# Patient Record
Sex: Female | Born: 1965
Health system: Southern US, Community
[De-identification: ages and names within clinical notes are randomized; demographics above are authoritative.]

## PROBLEM LIST (undated history)

## (undated) DIAGNOSIS — L309 Dermatitis, unspecified: Secondary | ICD-10-CM

## (undated) DIAGNOSIS — R002 Palpitations: Secondary | ICD-10-CM

## (undated) DIAGNOSIS — K760 Fatty (change of) liver, not elsewhere classified: Secondary | ICD-10-CM

## (undated) DIAGNOSIS — R0600 Dyspnea, unspecified: Secondary | ICD-10-CM

## (undated) DIAGNOSIS — K5900 Constipation, unspecified: Secondary | ICD-10-CM

## (undated) DIAGNOSIS — M549 Dorsalgia, unspecified: Secondary | ICD-10-CM

## (undated) DIAGNOSIS — M2550 Pain in unspecified joint: Secondary | ICD-10-CM

## (undated) DIAGNOSIS — R7303 Prediabetes: Secondary | ICD-10-CM

## (undated) DIAGNOSIS — F341 Dysthymic disorder: Secondary | ICD-10-CM

## (undated) DIAGNOSIS — K219 Gastro-esophageal reflux disease without esophagitis: Secondary | ICD-10-CM

## (undated) DIAGNOSIS — F419 Anxiety disorder, unspecified: Secondary | ICD-10-CM

## (undated) DIAGNOSIS — G4733 Obstructive sleep apnea (adult) (pediatric): Secondary | ICD-10-CM

## (undated) DIAGNOSIS — E559 Vitamin D deficiency, unspecified: Secondary | ICD-10-CM

## (undated) DIAGNOSIS — K589 Irritable bowel syndrome without diarrhea: Secondary | ICD-10-CM

## (undated) DIAGNOSIS — E785 Hyperlipidemia, unspecified: Secondary | ICD-10-CM

## (undated) HISTORY — DX: Constipation, unspecified: K59.00

## (undated) HISTORY — DX: Vitamin D deficiency, unspecified: E55.9

## (undated) HISTORY — PX: OTHER SURGICAL HISTORY: SHX169

## (undated) HISTORY — DX: Dorsalgia, unspecified: M54.9

## (undated) HISTORY — DX: Prediabetes: R73.03

## (undated) HISTORY — DX: Irritable bowel syndrome, unspecified: K58.9

## (undated) HISTORY — DX: Fatty (change of) liver, not elsewhere classified: K76.0

## (undated) HISTORY — DX: Dysthymic disorder: F34.1

## (undated) HISTORY — DX: Morbid (severe) obesity due to excess calories: E66.01

## (undated) HISTORY — DX: Gastro-esophageal reflux disease without esophagitis: K21.9

## (undated) HISTORY — PX: GALLBLADDER SURGERY: SHX652

## (undated) HISTORY — DX: Anxiety disorder, unspecified: F41.9

## (undated) HISTORY — PX: BREAST EXCISIONAL BIOPSY: SUR124

## (undated) HISTORY — DX: Dyspnea, unspecified: R06.00

## (undated) HISTORY — DX: Obstructive sleep apnea (adult) (pediatric): G47.33

## (undated) HISTORY — DX: Pain in unspecified joint: M25.50

## (undated) HISTORY — DX: Hyperlipidemia, unspecified: E78.5

## (undated) HISTORY — DX: Dermatitis, unspecified: L30.9

## (undated) HISTORY — DX: Palpitations: R00.2

---

## 2013-08-27 ENCOUNTER — Ambulatory Visit: Payer: Self-pay | Admitting: Podiatry

## 2013-09-04 ENCOUNTER — Telehealth: Payer: Self-pay

## 2013-09-04 NOTE — Telephone Encounter (Signed)
Patient called returning Chelle's call. Patient wants Chelle to call her back at [phone].   Thank You!

## 2013-09-04 NOTE — Telephone Encounter (Signed)
I dont see a phone call from Chelle. Please advise.

## 2013-09-05 NOTE — Telephone Encounter (Signed)
This patient was seen for a WC case.  Phone messaging and notes are found in her paper record and in Fish farm managerMedical Manager.  I have spoken with her by phone.

## 2013-09-12 ENCOUNTER — Ambulatory Visit (INDEPENDENT_AMBULATORY_CARE_PROVIDER_SITE_OTHER): Payer: 59 | Admitting: Podiatry

## 2013-09-12 ENCOUNTER — Encounter: Payer: Self-pay | Admitting: Podiatry

## 2013-09-12 ENCOUNTER — Ambulatory Visit (INDEPENDENT_AMBULATORY_CARE_PROVIDER_SITE_OTHER): Payer: 59

## 2013-09-12 VITALS — BP 132/70 | HR 70 | Resp 12 | Ht 65.0 in | Wt 281.0 lb

## 2013-09-12 DIAGNOSIS — R52 Pain, unspecified: Secondary | ICD-10-CM

## 2013-09-12 DIAGNOSIS — M779 Enthesopathy, unspecified: Secondary | ICD-10-CM

## 2013-09-12 DIAGNOSIS — M778 Other enthesopathies, not elsewhere classified: Secondary | ICD-10-CM

## 2013-09-12 DIAGNOSIS — M7750 Other enthesopathy of unspecified foot: Secondary | ICD-10-CM

## 2013-09-12 NOTE — Progress Notes (Signed)
   Subjective:    Patient ID: Amalia HaileyJulie Felch, female    DOB: 01/20/1966, 48 y.o.   MRN: [id number]  HPI '' LT BALL OF THE FOOT AND 2ND TOE HAVE BURNING SENSATION AND SORE FOR 2 MONTHS. ALSO RT FOOT GREAT TOENAIL IS A LITTLE SORE 1 YEAR AND TRIED NO TREATMENT.    Review of Systems  Constitutional: Positive for unexpected weight change.  All other systems reviewed and are negative.       Objective:   Physical Exam: I have reviewed her past medical history medications allergies surgeries social history. Also stable she is alert oriented x3. Neurologic sensorium is intact. No calf pain. Orthopedic evaluation demonstrates pain on palpation and in range of motion of the second metatarsophalangeal joint of the right foot.        Assessment & Plan:  Capsulitis second metatarsophalangeal joint.  Plan: Periarticular injection today.

## 2013-09-24 ENCOUNTER — Institutional Professional Consult (permissible substitution): Payer: 59 | Admitting: Neurology

## 2013-10-08 ENCOUNTER — Other Ambulatory Visit: Payer: Self-pay | Admitting: Obstetrics & Gynecology

## 2013-10-08 DIAGNOSIS — R928 Other abnormal and inconclusive findings on diagnostic imaging of breast: Secondary | ICD-10-CM

## 2013-10-10 ENCOUNTER — Ambulatory Visit: Payer: 59 | Admitting: Podiatry

## 2013-10-30 ENCOUNTER — Other Ambulatory Visit: Payer: Self-pay | Admitting: Obstetrics & Gynecology

## 2013-10-30 ENCOUNTER — Ambulatory Visit
Admission: RE | Admit: 2013-10-30 | Discharge: 2013-10-30 | Disposition: A | Discharge status: Home / Self Care | Payer: 59 | Source: Ambulatory Visit | Attending: Obstetrics & Gynecology | Admitting: Obstetrics & Gynecology

## 2013-10-30 DIAGNOSIS — R928 Other abnormal and inconclusive findings on diagnostic imaging of breast: Secondary | ICD-10-CM

## 2013-11-04 ENCOUNTER — Encounter: Payer: Self-pay | Admitting: *Deleted

## 2013-11-04 ENCOUNTER — Encounter (INDEPENDENT_AMBULATORY_CARE_PROVIDER_SITE_OTHER): Payer: Self-pay

## 2013-11-04 ENCOUNTER — Ambulatory Visit (INDEPENDENT_AMBULATORY_CARE_PROVIDER_SITE_OTHER): Payer: 59 | Admitting: Neurology

## 2013-11-04 VITALS — BP 112/70 | HR 69 | Resp 18 | Ht 65.75 in | Wt 278.0 lb

## 2013-11-04 DIAGNOSIS — E119 Type 2 diabetes mellitus without complications: Secondary | ICD-10-CM

## 2013-11-04 DIAGNOSIS — Z9989 Dependence on other enabling machines and devices: Principal | ICD-10-CM

## 2013-11-04 DIAGNOSIS — G4733 Obstructive sleep apnea (adult) (pediatric): Secondary | ICD-10-CM | POA: Insufficient documentation

## 2013-11-04 NOTE — Patient Instructions (Signed)
Obesity Obesity is having too much body fat and a body mass index (BMI) of 30 or more. BMI is a number based on your height and weight. The number is an estimate of how much body fat you have. Obesity can happen if you eat more calories than you can burn by exercising or other activity. It can cause major health problems or emergencies.  HOME CARE  Exercise and be active as told by your doctor. Try:  Using stairs when you can.  Parking farther away from store doors.  Gardening, biking, or walking.  Eat healthy foods and drinks that are low in calories. Eat more fruits and vegetables.  Limit fast food, sweets, and snack foods that are made with ingredients that are not natural (processed food).  Eat smaller amounts of food.  Keep a journal and write down what you eat every day. Websites can help with this.  Avoid drinking alcohol. Drink more water and drinks without calories.   Take vitamins and dietary pills (supplements) only as told by your doctor.  Try going to weight-loss support groups or classes to help lessen stress. Dieticians and counselors may also help. GET HELP RIGHT AWAY IF:  You have chest pain or tightness.  You have trouble breathing or feel short of breath.  You feel weak or have loss of feeling (numbness) in your legs.  You feel confused or have trouble talking.  You have sudden changes in your vision. MAKE SURE YOU:  Understand these instructions.  Will watch your condition.  Will get help right away if you are not doing well or get worse. Document Released: 10/17/2011 Document Reviewed: 10/17/2011 [facility] Patient Information 2014 [place], [place]. Sleep Apnea  Sleep apnea is a sleep disorder characterized by abnormal pauses in breathing while you sleep. When your breathing pauses, the level of oxygen in your blood decreases. This causes you to move out of deep sleep and into light sleep. As a result, your quality of sleep is poor, and the system that  carries your blood throughout your body (cardiovascular system) experiences stress. If sleep apnea remains untreated, the following conditions can develop:  High blood pressure (hypertension).  Coronary artery disease.  Inability to achieve or maintain an erection (impotence).  Impairment of your thought process (cognitive dysfunction). There are three types of sleep apnea: 1. Obstructive sleep apnea Pauses in breathing during sleep because of a blocked airway. 2. Central sleep apnea Pauses in breathing during sleep because the area of the brain that controls your breathing does not send the correct signals to the muscles that control breathing. 3. Mixed sleep apnea A combination of both obstructive and central sleep apnea. RISK FACTORS The following risk factors can increase your risk of developing sleep apnea:  Being overweight.  Smoking.  Having narrow passages in your nose and throat.  Being of older age.  Being female.  Alcohol use.  Sedative and tranquilizer use.  Ethnicity. Among individuals younger than 35 years, African Americans are at increased risk of sleep apnea. SYMPTOMS   Difficulty staying asleep.  Daytime sleepiness and fatigue.  Loss of energy.  Irritability.  Loud, heavy snoring.  Morning headaches.  Trouble concentrating.  Forgetfulness.  Decreased interest in sex. DIAGNOSIS  In order to diagnose sleep apnea, your caregiver will perform a physical examination. Your caregiver may suggest that you take a home sleep test. Your caregiver may also recommend that you spend the night in a sleep lab. In the sleep lab, several monitors record information  about your heart, lungs, and brain while you sleep. Your leg and arm movements and blood oxygen level are also recorded. TREATMENT The following actions may help to resolve mild sleep apnea:  Sleeping on your side.   Using a decongestant if you have nasal congestion.   Avoiding the use of  depressants, including alcohol, sedatives, and narcotics.   Losing weight and modifying your diet if you are overweight. There also are devices and treatments to help open your airway:  Oral appliances. These are custom-made mouthpieces that shift your lower jaw forward and slightly open your bite. This opens your airway.  Devices that create positive airway pressure. This positive pressure "splints" your airway open to help you breathe better during sleep. The following devices create positive airway pressure:  Continuous positive airway pressure (CPAP) device. The CPAP device creates a continuous level of air pressure with an air pump. The air is delivered to your airway through a mask while you sleep. This continuous pressure keeps your airway open.  Nasal expiratory positive airway pressure (EPAP) device. The EPAP device creates positive air pressure as you exhale. The device consists of single-use valves, which are inserted into each nostril and held in place by adhesive. The valves create very little resistance when you inhale but create much more resistance when you exhale. That increased resistance creates the positive airway pressure. This positive pressure while you exhale keeps your airway open, making it easier to breath when you inhale again.  Bilevel positive airway pressure (BPAP) device. The BPAP device is used mainly in patients with central sleep apnea. This device is similar to the CPAP device because it also uses an air pump to deliver continuous air pressure through a mask. However, with the BPAP machine, the pressure is set at two different levels. The pressure when you exhale is lower than the pressure when you inhale.  Surgery. Typically, surgery is only done if you cannot comply with less invasive treatments or if the less invasive treatments do not improve your condition. Surgery involves removing excess tissue in your airway to create a wider passage way. Document  Released: 07/15/2002 Document Revised: 11/19/2012 Document Reviewed: 12/01/2011 [facility] Patient Information 2014 [place], [place].

## 2013-11-04 NOTE — Progress Notes (Signed)
[facility]  Provider:  Melvyn Novasarmen  Mckale Haffey, M D  Referring Provider: Marthe Webster, Michelle Stewa* Primary Care Physician:  Michelle Webster,Michelle STEWART, MD  Chief Complaint  Patient presents with  . New Evaluation    Room 10  . sleep consult    HPI:  Michelle Webster is a 48 y.o. female  Is seen here as a referral   from Dr. Mathis Webster , Michelle Webster . Her new PCP will be Dr. Zachery Webster for new CPAP suplpies.    Mrs. Michelle Webster (at that time Michelle Webster) was tested  on 11/[facility] by Dr. Catha Webster.  She was diagnosed with obstructive sleep apnea with an RDI of 72 per hour and this all oxygen levels at nadir at 82% of saturation. She also was moderately snoring.  The AHI was 37 per hour indicating that she had a severe sleep apnea at the time of her sleep test her BMI was 42, CO2 had not been tested. The patient was titrated to 11 cm she also states that she has been compliant with her machine but needs new supplies. [place] 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  In preparation for her wedding ,she lost 30 pounds to 259 . She regained after her honeymoon and is today 278 pounds.   She feels sleepier and her supplies need to be ordered , she establishes here a new DME and MD connection.   The patient reports that she pulls to bed around 2300 hours, he falls asleep fairly promptly. She will have more nocturia break at night. She rises in the morning at 5:30 AM she will need alarm. She has regular breakfast in the morning, feels that she would like to sleep longer. Her diet is caffee free, she drinks tea. Doesn't nap.    The patient is aware that she snores loudly when not using CPAP, but as far as she knows she is not snoring when on CPAP.  Sometimes she would have a dry mouth in the morning.  Some mornings she may have a dull headache but she never has been awoken by  headaches from sleep. She has no shift work  history. Her bedroom is cool , quiet and dark.    the patient has a strong family history of obesity and apnea, father and paternal aunts and uncles, her sister tested negative 5 years ago, she is [name] , too. Works first shift . her mother underwent a sleep study after falling asleep at the wheel and causing an accident.     I was able to obtain data downloads from the CPAP machine today the average daily use of CPAP at 7 hours and 29 minutes indicating very high compliance.  The patient has a 99.4 % compliance 180 days. Her residual apnea hypoxia index is 0.5, there is only a mild air leak . 11 cm water had been continued  with good results.  Her husband has complex apnea is on BIPAP on 14 cm water.      Review of Systems: Out of a complete 14 system review, the patient complains of only the following symptoms, and all other reviewed systems are negative.  snoring, EDS  Not answered and FSS was 36.  Weight gain.    History   Social History  . Marital Status: Married    Spouse Name: Michelle NovemberMike    Number of Children: 0  . Years of Education: BSN  Occupational History  .  [employer]   Social History Main Topics  . Smoking status: Never Smoker   . Smokeless tobacco: Never Used  . Alcohol Use: No  . Drug Use: Not on file  . Sexual Activity: Not on file   Other Topics Concern  . Not on file   Social History Narrative   Patient is married Michelle November) and lives at home with her husband.   Patient is a Charity fundraiser, works at [employer].   Patient has a Barista.   Patient is right-handed.   Patient drinks tea (2-3 glasses ) per day.    History reviewed. No pertinent family history.  Past Medical History  Diagnosis Date  . Anxiety   . Dyspnea   . Morbid obesity   . Neurotic depression   . OSA (obstructive sleep apnea)     Past Surgical History  Procedure Laterality Date  . Lum pectomy    . Gallbladder surgery      Current Outpatient  Prescriptions  Medication Sig Dispense Refill  . Cholecalciferol (VITAMIN D) 2000 UNITS tablet Take 2,000 Units by mouth daily.      . citalopram (CELEXA) 40 MG tablet       . docusate sodium (COLACE) 100 MG capsule Take 100 mg by mouth 2 (two) times daily.      . magnesium 30 MG tablet Take 30 mg by mouth 2 (two) times daily. As needed      . metFORMIN (GLUCOPHAGE) 500 MG tablet Take 500 mg by mouth daily.      [name] omeprazole (PRILOSEC) 20 MG capsule       . Prenatal Vit-Fe Fumarate-FA (PRENATAL MULTIVITAMIN) TABS tablet Take 1 tablet by mouth daily at 12 noon.       No current facility-administered medications for this visit.    Allergies as of 11/04/2013 - Review Complete 11/04/2013  Allergen Reaction Noted  . Vicodin [hydrocodone-acetaminophen] Itching 09/12/2013    Vitals: BP 112/70  Pulse 69  Resp 18  Ht 5' 5.75" (1.67 m)  Wt 278 lb (126.1 kg)  BMI 45.21 kg/m2  LMP 09/12/2013 Last Weight:  Wt Readings from Last 1 Encounters:  11/04/13 278 lb (126.1 kg)   Last Height:   Ht Readings from Last 1 Encounters:  11/04/13 5' 5.75" (1.67 m)    Physical exam:  General: The patient is awake, alert and appears not in acute distress. The patient is well groomed. Head: Normocephalic, atraumatic. Neck is supple. Mallampati 3, neck circumference:19.5 inches. Cardiovascular:  Regular rate and rhythm, without  murmurs or carotid bruit, and without distended neck veins. Respiratory: Lungs are clear to auscultation. Skin:  Without evidence of edema, or rash Trunk: BMI is elevated and patient  has normal posture.  Neurologic exam : The patient is awake and alert, oriented to place and time.  Memory subjective  described as intact. There is a normal attention span & concentration ability.  Speech is fluent without  dysarthria, dysphonia or aphasia. Mood and affect are appropriate.  Cranial nerves: Pupils are equal and briskly reactive to light. Funduscopic exam without  evidence of  pallor or edema. Extraocular movements  in vertical and horizontal planes intact and without nystagmus. Visual fields by finger perimetry are intact. Hearing to finger rub intact.  Facial sensation intact to fine touch. Facial motor strength is symmetric and tongue and uvula move midline.  Motor exam:   Normal tone and normal muscle bulk and symmetric normal strength in  all extremities.  Sensory:  Fine touch, pinprick and vibration were tested in all extremities. Proprioception is tested in the upper extremities only. This was  normal.  Coordination: Rapid alternating movements in the fingers/hands is tested and normal.   Gait and station: Patient walks without assistive device . Strength within normal limits. Stance is stable and normal.  Deep tendon reflexes: in the  upper and lower extremities are symmetric and intact. Babinski maneuver response is  downgoing.   Assessment:  After physical and neurologic examination, review of laboratory studies, imaging, neurophysiology testing and pre-existing records, assessment is    1)resurgence of fatigue and sleepiness in a patient with recent diagnosis of DM and weight gain.  2) thyroid was fine. No depression.  3) download documented excellent CPAP compliance , and very low residual AHI. She would not medically need a re- titration unless her insurance or DME insist.  I will order a new filter, headset and replacement mask for small size , true blue nasal mask from [employer].      Plan:  Treatment plan and additional workup :

## 2013-11-05 ENCOUNTER — Encounter: Payer: Self-pay | Admitting: Neurology

## 2013-11-26 ENCOUNTER — Ambulatory Visit: Payer: 59 | Admitting: Podiatry

## 2013-11-26 ENCOUNTER — Encounter: Payer: Self-pay | Admitting: Podiatry

## 2013-11-26 ENCOUNTER — Ambulatory Visit (INDEPENDENT_AMBULATORY_CARE_PROVIDER_SITE_OTHER): Payer: 59 | Admitting: Podiatry

## 2013-11-26 VITALS — BP 128/71 | HR 70 | Resp 16

## 2013-11-26 DIAGNOSIS — M779 Enthesopathy, unspecified: Principal | ICD-10-CM

## 2013-11-26 DIAGNOSIS — M7750 Other enthesopathy of unspecified foot: Secondary | ICD-10-CM

## 2013-11-26 DIAGNOSIS — M778 Other enthesopathies, not elsewhere classified: Secondary | ICD-10-CM

## 2013-11-26 NOTE — Progress Notes (Signed)
She presents today for followup of her capsulitis second digit left foot. She states that it was doing pretty good for about a month and then I missed my appointment and has slowly started to come back. She does relate wearing flip flops and sandals and going barefoot. He still feels better now than it did previously.  Objective: Vital signs are stable she is alert and oriented x3 pulses are palpable there is no calf pain. She has pain on palpation as well as distraction of the second metatarsophalangeal joint. There is mild edema no erythema cellulitis drainage or odor.  Assessment: Capsulitis second metatarsophalangeal joint left.  Plan: At this point we performed a joint aspiration after sterile Betadine skin prep of the skin we now.the foot with 3 cc of a 50-50 Mr. Marcaine plain lidocaine plain. I then introduced a needle into the joint space draining approximately one half cc of joint fluid and then replaced back with one half cc of Kenalog. She tolerated procedure well we discussed shoe gear and I will followup with her in one month to 6 weeks at which time we will discuss orthotics if necessary. She relates that her mother had her foot like this.

## 2013-12-31 ENCOUNTER — Other Ambulatory Visit: Payer: Self-pay | Admitting: Obstetrics & Gynecology

## 2013-12-31 ENCOUNTER — Other Ambulatory Visit: Payer: Self-pay

## 2013-12-31 DIAGNOSIS — N6489 Other specified disorders of breast: Secondary | ICD-10-CM

## 2014-01-14 ENCOUNTER — Encounter: Payer: Self-pay | Admitting: Podiatry

## 2014-01-14 ENCOUNTER — Ambulatory Visit (INDEPENDENT_AMBULATORY_CARE_PROVIDER_SITE_OTHER): Payer: 59 | Admitting: Podiatry

## 2014-01-14 VITALS — BP 130/67 | HR 80 | Resp 16

## 2014-01-14 DIAGNOSIS — M778 Other enthesopathies, not elsewhere classified: Secondary | ICD-10-CM

## 2014-01-14 DIAGNOSIS — M7750 Other enthesopathy of unspecified foot: Secondary | ICD-10-CM

## 2014-01-14 DIAGNOSIS — M779 Enthesopathy, unspecified: Principal | ICD-10-CM

## 2014-01-14 NOTE — Progress Notes (Signed)
She presents today for followup of capsulitis to the second metatarsophalangeal joint of the left foot. She states it is 100% better I thought about canceling this appointment.  Objective: Vital signs are stable she is alert and oriented x3 much of the edema to the plantar aspect of the second metatarsophalangeal joint has subsided. She has a good pain-free range of motion of the second metatarsophalangeal joint of the left foot.  Assessment: Resolved capsulitis second metatarsophalangeal joint left foot.  Plan: Followup as needed.

## 2014-01-22 ENCOUNTER — Encounter (INDEPENDENT_AMBULATORY_CARE_PROVIDER_SITE_OTHER): Payer: Self-pay

## 2014-01-22 ENCOUNTER — Ambulatory Visit
Admission: RE | Admit: 2014-01-22 | Discharge: 2014-01-22 | Disposition: A | Discharge status: Home / Self Care | Payer: 59 | Source: Ambulatory Visit | Attending: Obstetrics & Gynecology | Admitting: Obstetrics & Gynecology

## 2014-01-22 DIAGNOSIS — N6489 Other specified disorders of breast: Secondary | ICD-10-CM

## 2014-03-11 ENCOUNTER — Ambulatory Visit (INDEPENDENT_AMBULATORY_CARE_PROVIDER_SITE_OTHER): Payer: 59 | Admitting: Podiatry

## 2014-03-11 ENCOUNTER — Encounter: Payer: Self-pay | Admitting: Podiatry

## 2014-03-11 VITALS — BP 126/71 | HR 73 | Resp 16

## 2014-03-11 DIAGNOSIS — M778 Other enthesopathies, not elsewhere classified: Secondary | ICD-10-CM

## 2014-03-11 DIAGNOSIS — M7750 Other enthesopathy of unspecified foot: Secondary | ICD-10-CM

## 2014-03-11 DIAGNOSIS — M779 Enthesopathy, unspecified: Principal | ICD-10-CM

## 2014-03-11 NOTE — Progress Notes (Signed)
She presents today for a followup of her capsulitis second metatarsophalangeal joint of the right foot.  Objective: Vital signs are stable she is alert and oriented x3 she has pain on end range of motion of the second metatarsophalangeal joint right foot.  Assessment: Chronic intractable capsulitis second metatarsophalangeal joint of the right foot.  Plan: Reinjected the joint today with dexamethasone and local anesthetic and she was scanned for orthotics before she left the building.

## 2014-03-14 ENCOUNTER — Telehealth: Payer: Self-pay | Admitting: *Deleted

## 2014-03-14 NOTE — Telephone Encounter (Signed)
Been treating for Capsulitis.  Had 3rd injection on Tues.  Every since then, my toe is red and I can't bend it.  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  One of the doctors here said I need to have him check it out.  If there's a time he could see me today, that would be great.  I called and asked her if she could come in today to see Dr. Ardelle AntonWagoner, Dr. Al CorpusHyatt is not in the office.  She asked do I really need to come in is there something they can tell without seeing me.  I told her no because they are not familiar with your treatment.  She stated I would hate to come there and there's nothing they can do.  I told her I can see if they have any suggestions.  She stated how about I just see how it goes over the weekend.  If not better by Monday I will schedule an appointment.  I told her okay.

## 2014-04-04 ENCOUNTER — Ambulatory Visit: Payer: 59

## 2014-04-04 DIAGNOSIS — M779 Enthesopathy, unspecified: Principal | ICD-10-CM

## 2014-04-04 DIAGNOSIS — M778 Other enthesopathies, not elsewhere classified: Secondary | ICD-10-CM

## 2014-04-04 NOTE — Patient Instructions (Signed)

## 2014-04-04 NOTE — Progress Notes (Signed)
   Subjective:    Patient ID: Michelle Webster, female    DOB: [birth year], 48 y.o.   MRN: [id number]  HPI  Pt is here to PUO  Review of Systems     Objective:   Physical Exam        Assessment & Plan:

## 2014-04-08 ENCOUNTER — Ambulatory Visit: Payer: 59 | Admitting: Podiatry

## 2014-04-08 ENCOUNTER — Ambulatory Visit (INDEPENDENT_AMBULATORY_CARE_PROVIDER_SITE_OTHER): Payer: 59

## 2014-04-08 VITALS — BP 129/69 | HR 77 | Resp 16

## 2014-04-08 DIAGNOSIS — M778 Other enthesopathies, not elsewhere classified: Secondary | ICD-10-CM

## 2014-04-08 DIAGNOSIS — M779 Enthesopathy, unspecified: Principal | ICD-10-CM

## 2014-04-08 DIAGNOSIS — M7750 Other enthesopathy of unspecified foot: Secondary | ICD-10-CM

## 2014-04-08 DIAGNOSIS — R52 Pain, unspecified: Secondary | ICD-10-CM

## 2014-04-08 MED ORDER — PREDNISONE 10 MG PO KIT: PACK | ORAL | Status: DC

## 2014-04-08 NOTE — Patient Instructions (Signed)
ICE INSTRUCTIONS  Apply ice or cold pack to the affected area at least 3 times a day for 10-15 minutes each time.  You should also use ice after prolonged activity or vigorous exercise.  Do not apply ice longer than 20 minutes at one time.  Always keep a cloth between your skin and the ice pack to prevent burns.  Being consistent and following these instructions will help control your symptoms.  We suggest you purchase a gel ice pack because they are reusable and do bit leak.  Some of them are designed to wrap around the area.  Use the method that works best for you.  Here are some other suggestions for icing.   Use a frozen bag of peas or corn-inexpensive and molds well to your body, usually stays frozen for 10 to 20 minutes.  Wet a towel with cold water and squeeze out the excess until it's damp.  Place in a bag in the freezer for 20 minutes. Then remove and use.  Alternate applying hot compresses such as a hot towel for 10 minutes and then the ice pack for 10 minutes repeat hot cold hot and cold 2 or 3 times every evening

## 2014-04-08 NOTE — Progress Notes (Signed)
   Subjective:    Patient ID: Michelle Webster, female    DOB: [birth year], 48 y.o.   MRN: [id number]  HPI Comments: "Its so sore and the last shot he gave me did do anything"  Follow up capsulitis 2nd MPJ left    Foot Pain      Review of Systems no new findings or  significant systemic change     Objective:   Physical Exam 48 year old white female shifts at this time for followup of painful left forefoot second MTP joint capsule. Patient had 3 injections thus far to her health will help the last injection done approximately month ago provided no relief and check she had significant bruising after the injection had a picture on her cell phone demonstrating bruising at the base of the toe. Lotion the objective findings as follows vascular status is intact pedal pulses palpable DP +2/4 PT +2/4 capillary refill time 3 seconds all digits epicritic and proprioceptive sensations intact and symmetric bilateral there is normal plantar response DTRs not listed neurologically skin color pigment normal hair growth absent nails unremarkable. Patient is a prediabetic is on metformin for that otherwise seems to be well-managed orthopedic biomechanical exam appears to have rectus foot no x-rays taken at this time there is pain on palpation of the second MTP joint the interspaces tenderness although the MTP plantar capsular second is most painful tender and symptomatic no ecchymosis is noted at today's visit temperature no signs of infection no history of injury or trauma noted patient wearing or athletic shoes with orthotics at were obtained last Friday wearing them for about 4 days.       Assessment & Plan:  Assessment persistent recalcitrant capsulitis second MTP joint left foot cannot rule out previous location type syndrome patient requested additional injections hours had 3 injections within the last 6 months patient is advised additional injections can actually destabilize the joint or cause a tendon or  ligament rupture or tear as the last injection had no improvement in the toe continues to be painful tender symptomatic recommendation is to avoid injection at this time as alternative patient placed on a Sterapred DS Dosepak x6 days. Also recommended warm compress ice pack to be applied alternate hot and cold therapy to the joint. Orthotics are also just at this time placed a metatarsal pad on both orthotics in particular the left orthotic to help offload the second MTP joint patient noticed an improvement within her shoes with chief started walking following the adjustment. Again stressed the medication also ice and completed Dosepak as instructed followup with Dr. Al Corpus with the next 2 or 3 weeks if there is no significant improvement with stabilization of condition  Alvan Dame DPM

## 2014-04-22 ENCOUNTER — Ambulatory Visit: Payer: 59 | Admitting: Podiatry

## 2014-05-14 ENCOUNTER — Ambulatory Visit: Payer: 59 | Admitting: Neurology

## 2014-05-21 ENCOUNTER — Ambulatory Visit: Payer: 59 | Admitting: Neurology

## 2014-10-23 ENCOUNTER — Encounter: Payer: Self-pay | Admitting: Neurology

## 2014-10-23 ENCOUNTER — Ambulatory Visit (INDEPENDENT_AMBULATORY_CARE_PROVIDER_SITE_OTHER): Payer: 59 | Admitting: Neurology

## 2014-10-23 VITALS — BP 111/68 | HR 80 | Resp 12 | Ht 65.0 in | Wt 296.2 lb

## 2014-10-23 DIAGNOSIS — G4733 Obstructive sleep apnea (adult) (pediatric): Secondary | ICD-10-CM

## 2014-10-23 DIAGNOSIS — Z9989 Dependence on other enabling machines and devices: Principal | ICD-10-CM

## 2014-10-23 NOTE — Progress Notes (Signed)
[facility]  Provider:  Melvyn Webster, M D  Referring Provider: Juluis Rainier, MD Primary Care Physician:  Michelle [place], MD  Chief Complaint  Patient presents with  . RV cpap    Rm 10, alone    HPI:  Michelle Webster is a 49 y.o. female  Is seen here as a referral   from Dr. Mathis Webster , Rolene Arbour . Her new PCP will be Dr. Zachery Webster for new CPAP suplpies.      Michelle Webster (at that time Michelle Webster) was tested  on 11/21/2008 at the [facility] by Dr. Catha Webster.  She was diagnosed with obstructive sleep apnea with an RDI of 72 per hour and this all oxygen levels at nadir at 82% of saturation. She also was moderately snoring.  The AHI was 37 per hour indicating that she had a severe sleep apnea at the time of her sleep test her BMI was 42, CO2 had not been tested. The patient was titrated to 11 cm she also states that she has been compliant with her machine but needs new supplies. She has just moved this area from [place].  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 In preparation for her wedding ,she lost 30 pounds to 259 . She regained after her honeymoon and is today 278 pounds.   The patient reports that she pulls to bed around 2300 hours, he falls asleep fairly promptly. She will have more nocturia break at night. She rises in the morning at 5:30 AM she will need alarm. She has regular breakfast in the morning, feels that she would like to sleep longer. Her diet is caffee free, she drinks tea. Doesn't nap.  The patient is aware that she snores loudly when not using CPAP, but as far as she knows she is not snoring when on CPAP. Sometimes she would have a dry mouth in the morning. Some mornings she may have a dull headache but she never has been awoken by  headaches from sleep.She has no shift work history.Her bedroom is cool , quiet and dark. This  patient has a strong family history of obesity and apnea, father and paternal aunts and uncles,  her sister tested negative 5 years ago, she is Michelle Webster , too. Works first shift . her mother underwent a sleep study after falling asleep at the wheel and causing an accident.  I was able to obtain data downloads from the CPAP machine- the average daily use of CPAP at 7 hours and 29 minutes indicating very high compliance. The patient has a 99.4 % compliance 180 days. Her residual apnea hypoxia index is 0.5, there is only a mild air leak . 11 cm water had been continued  with good results.Her husband has complex apnea is on BIPAP on 14 cm water.     10-23-14 Interval: Mrs. Michelle Webster reports that her husband has been of concerning health recently and that he was tentatively diagnosed with a polymyositis. He had received IV steroids and an oral taper but he failed to get much better. He is now awaiting a [facility] specialty consultation in April of this year. She reports that she has gained further weight since being married. But she has continued to be highly compliant with his CPAP and a 30 day download today reports 100% compliance over 4 hours of daily use the average user time is 7 hours and 43 minutes the average AHI is 0.5 on 11 cm water setting.  This is also reflected in the low fatigue severity score of 26 points her Epworth sleepiness score is at 13 points. Mr. Michelle Webster has mentioned to his wife that she goes to sleep every time they're in church.  She continues to take Glucophage, Prilosec, Colace and Celexa and vitamin D supplements she also takes prenatal vitamins upon my request. She feels sleepier and her supplies need to be ordered , she established her DME and MD connection.         Review of Systems: Out of a complete 14 system review, the patient complains of only the following symptoms, and all other reviewed systems are negative.  snoring, EDS 13  Points,  and FSS was  26 from 36.  Weight gain.    History   Social History  . Marital Status: Married    Spouse Name: Michelle Webster  . Number of  Children: 0  . Years of Education: BSN   Occupational [place]  [place]  [employer]   Social History Main Topics  . Smoking status: Never Smoker   . Smokeless tobacco: Never Used  . Alcohol Use: No  . Drug Use: Not on file  . Sexual Activity: Not on file   Other Topics Concern  . Not on file   Social History Narrative   Patient is married Michelle November([name]) and lives at home with her husband.   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   Patient is right-handed.   Patient drinks tea (2-3 glasses ) per day.    Family History  Problem Relation Age of Onset  . Hypertension Father   . Other Father     cardiac ablations x 2  . Depression Mother     ECT    Past Medical History  Diagnosis Date  . Anxiety   . Dyspnea   . Morbid obesity   . Neurotic depression   . OSA (obstructive sleep apnea)     Past Surgical History  Procedure Laterality Date  . Lum pectomy    . Gallbladder surgery      Current Outpatient Prescriptions  Medication Sig Dispense Refill  . Cholecalciferol (VITAMIN D) 2000 UNITS tablet Take 2,000 Units by mouth daily.    . citalopram (CELEXA) 40 MG tablet     . docusate sodium (COLACE) 100 MG capsule Take 100 mg by mouth 2 (two) times daily.    . metFORMIN (GLUCOPHAGE) 500 MG tablet Take 500 mg by mouth daily.    [name]. omeprazole (PRILOSEC) 20 MG capsule     . Prenatal Vit-Fe Fumarate-FA (PRENATAL MULTIVITAMIN) TABS tablet Take 1 tablet by mouth daily at 12 noon.     No current facility-administered medications for this visit.    Allergies as of 10/23/2014 - Review Complete 10/23/2014  Allergen Reaction Noted  . Vicodin [hydrocodone-acetaminophen] Itching 09/12/2013    Vitals: BP 111/68 mmHg  Pulse 80  Resp 12  Ht 5\' 5"  (1.651 m)  Wt 296 lb 3.2 oz (134.355 kg)  BMI 49.29 kg/m2 Last Weight:  Wt Readings from Last 1 Encounters:  10/23/14 296 lb 3.2 oz (134.355 kg)   Last Height:   Ht Readings from  Last 1 Encounters:  10/23/14 5\' 5"  (1.651 m)    Physical exam:  General: The patient is awake, alert and appears not in acute distress. The patient is well groomed. Head: Normocephalic, atraumatic. Neck is supple. Mallampati 3, neck circumference:19.5 inches. Cardiovascular:  Regular rate and  rhythm, without  murmurs or carotid bruit, and without distended neck veins. Respiratory: Lungs are clear to auscultation. Skin:  Without evidence of edema, or rash Trunk: BMI is elevated and patient  has normal posture.  Neurologic exam : The patient is awake and alert, oriented to place and time.  Memory subjective  described as intact. There is a normal attention span & concentration ability.  Speech is fluent without  dysarthria, dysphonia or aphasia. Mood and affect are appropriate.  Cranial nerves: Pupils are equal and briskly reactive to light. Hearing to finger rub intact.  Facial sensation intact to fine touch. Facial motor strength is symmetric and tongue and uvula move midline. Motor exam:   Normal tone and normal muscle bulk and symmetric normal strength in all extremities.   Assessment:  After physical and neurologic examination, review of laboratory studies, imaging, neurophysiology testing and pre-existing records,  15 minute assessment is    1) obesity ; resurgence of fatigue and sleepiness in a patient with recent diagnosis of DM and weight gain.  The [facility] uses [facility] his [facility] medical equipment company. Since last seen here last year she has only gotten 2 times supplies for her CPAP machine. She got  a new interface , headgear She is also due for a new mask, liner and  new filter. I gave  the patient's a "caring for your CPAP equipment "handout. The patient has a nasal  mask available . Petite size.    2) download documented excellent CPAP compliance , and very low residual AHI.    Plan:  Treatment plan and additional workup : [facility] to refill CPAP needs. Rv in  12 month.

## 2014-10-24 ENCOUNTER — Encounter: Payer: Self-pay | Admitting: Neurology

## 2014-11-28 ENCOUNTER — Encounter: Payer: Self-pay | Admitting: Neurology

## 2014-12-02 ENCOUNTER — Other Ambulatory Visit: Payer: Self-pay

## 2014-12-02 DIAGNOSIS — Z1231 Encounter for screening mammogram for malignant neoplasm of breast: Secondary | ICD-10-CM

## 2015-01-26 ENCOUNTER — Ambulatory Visit
Admission: RE | Admit: 2015-01-26 | Discharge: 2015-01-26 | Disposition: A | Discharge status: Home / Self Care | Payer: 59 | Source: Ambulatory Visit

## 2015-01-26 DIAGNOSIS — Z1231 Encounter for screening mammogram for malignant neoplasm of breast: Secondary | ICD-10-CM

## 2015-08-29 DIAGNOSIS — Z6841 Body Mass Index (BMI) 40.0 and over, adult: Secondary | ICD-10-CM | POA: Insufficient documentation

## 2015-08-29 DIAGNOSIS — F329 Major depressive disorder, single episode, unspecified: Secondary | ICD-10-CM | POA: Insufficient documentation

## 2015-08-29 DIAGNOSIS — R7301 Impaired fasting glucose: Secondary | ICD-10-CM | POA: Insufficient documentation

## 2015-08-29 DIAGNOSIS — K219 Gastro-esophageal reflux disease without esophagitis: Secondary | ICD-10-CM | POA: Insufficient documentation

## 2015-08-29 DIAGNOSIS — E559 Vitamin D deficiency, unspecified: Secondary | ICD-10-CM | POA: Insufficient documentation

## 2015-08-29 DIAGNOSIS — E781 Pure hyperglyceridemia: Secondary | ICD-10-CM | POA: Insufficient documentation

## 2015-08-31 DIAGNOSIS — Z0000 Encounter for general adult medical examination without abnormal findings: Secondary | ICD-10-CM | POA: Insufficient documentation

## 2015-10-13 ENCOUNTER — Encounter: Payer: Self-pay | Admitting: Podiatry

## 2015-10-13 ENCOUNTER — Ambulatory Visit (INDEPENDENT_AMBULATORY_CARE_PROVIDER_SITE_OTHER): Payer: 59 | Admitting: Podiatry

## 2015-10-13 ENCOUNTER — Other Ambulatory Visit: Payer: Self-pay | Admitting: Podiatry

## 2015-10-13 ENCOUNTER — Ambulatory Visit (INDEPENDENT_AMBULATORY_CARE_PROVIDER_SITE_OTHER): Payer: 59

## 2015-10-13 DIAGNOSIS — M79671 Pain in right foot: Secondary | ICD-10-CM

## 2015-10-13 DIAGNOSIS — M722 Plantar fascial fibromatosis: Secondary | ICD-10-CM | POA: Diagnosis not present

## 2015-10-13 MED ORDER — MELOXICAM 15 MG PO TABS: 15.0000 mg | ORAL_TABLET | Freq: Every day | ORAL | Status: DC

## 2015-10-13 MED ORDER — METHYLPREDNISOLONE 4 MG PO TBPK
ORAL_TABLET | ORAL | Status: DC
Start: 2015-10-13 — End: 2015-10-26

## 2015-10-14 NOTE — Progress Notes (Signed)
She presents today with a chief complaint painful right heel times the past 2-3 months. She relates no trauma and states that it bothers her after she's been sitting for a while or when she first gets up in the morning. She states that she's been using ice and taking ibuprofen like candy. She denies changes in her past medical history medications allergies surgery social history.  Objective: Vital signs are stable alert and oriented 3. Pulses are fine with palpable. Neurologic sensorium is intact. Deep tendon reflexes are intact muscle strength +5 over 5 dorsiflexion plantar flexors and inverters everters all intrinsic musculature is intact. Orthopedic evaluation demonstrates all joints distal to the ankle for range of motion without crepitation. Mild hallux valgus deformities bilateral but are asymptomatic. She has pain on palpation medial and tubercle of the right heel. Radiographs confirm a soft tissue increase in density of the plantar fascial calcaneal insertion site of the right heel with a small plantar distally oriented calcaneal heel spur.  Assessment: Plantar fasciitis right.  Plan: Discussed etiology pathology conservative versus surgical therapies. We discussed appropriate shoe gear stretching exercises ice therapy issue modifications. I provided her with a prescription for meloxicam 15 mg 1 tablet by mouth daily. I injected her right heel today with Kenalog and local anesthetic. Placed her in a plantar fascial brace and a night splint. I will follow up with her 1 month

## 2015-10-26 ENCOUNTER — Ambulatory Visit (INDEPENDENT_AMBULATORY_CARE_PROVIDER_SITE_OTHER): Payer: 59 | Admitting: Adult Health

## 2015-10-26 ENCOUNTER — Encounter: Payer: Self-pay | Admitting: Adult Health

## 2015-10-26 VITALS — BP 125/72 | HR 77 | Ht 65.0 in | Wt 294.0 lb

## 2015-10-26 DIAGNOSIS — G4733 Obstructive sleep apnea (adult) (pediatric): Secondary | ICD-10-CM | POA: Diagnosis not present

## 2015-10-26 DIAGNOSIS — Z9989 Dependence on other enabling machines and devices: Principal | ICD-10-CM

## 2015-10-26 NOTE — Progress Notes (Signed)
I agree with the assessment and plan as directed by NP .The patient is known to me .   Garv Kuechle, MD  

## 2015-10-26 NOTE — Patient Instructions (Signed)
Continue using CPAP nightly If your symptoms worsen or you develop new symptoms please let us know.   

## 2015-10-26 NOTE — Progress Notes (Signed)
PATIENT: Michelle Webster DOB: [birth year]  REASON FOR VISIT: follow up- obstructive sleep apnea on CPAP HISTORY FROM: patient  HISTORY OF PRESENT ILLNESS: Michelle Webster is a 50 year old female with a history of obstructive sleep apnea on CPAP. She returns today for her yearly compliance download. Her download indicates that she uses her machine 30 out of 30 days for compliance of 100%. She uses her machine greater than 4 hours each night. On average she uses her machine 7 hours and 30 minutes. Her residual AHI is 0.6 on 11 cm of water. She does not have a significant leak. The patient states that overall she is doing well. She has only gone one night without using the machine since she got it. She does feel that it's help with her fatigue. She states that at the end of the day when she sits down she falls asleep but she contributes this to being exhausted. She no longer falls asleep during work or driving. She returns today for an evaluation.  HISTORY 10/23/14: Michelle Webster is a 50 y.o. female Is seen here as a referral from Dr. Mathis Bud , Rolene Arbour . Her new PCP will be Dr. Zachery Dauer for new CPAP suplpies.  Mrs. Grondahl (at that time [name]) was tested on 11/21/2008 at the [facility] by Dr. Catha Gosselin.  She was diagnosed with obstructive sleep apnea with an RDI of 72 per hour and this all oxygen levels at nadir at 82% of saturation. She also was moderately snoring.  The AHI was 37 per hour indicating that she had a severe sleep apnea at the time of her sleep test her BMI was 42, CO2 had not been tested. The patient was titrated to 11 cm she also states that she has been compliant with her machine but needs new supplies. She has just moved this area from [place].  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 In preparation for her wedding ,she lost 30 pounds to 259 . She regained after her honeymoon and is today 278 pounds.   The patient reports that she pulls to bed  around 2300 hours, he falls asleep fairly promptly. She will have more nocturia break at night. She rises in the morning at 5:30 AM she will need alarm. She has regular breakfast in the morning, feels that she would like to sleep longer. Her diet is caffee free, she drinks tea. Doesn't nap.  The patient is aware that she snores loudly when not using CPAP, but as far as she knows she is not snoring when on CPAP. Sometimes she would have a dry mouth in the morning. Some mornings she may have a dull headache but she never has been awoken by headaches from sleep.She has no shift work history.Her bedroom is cool , quiet and dark. This patient has a strong family history of obesity and apnea, father and paternal aunts and uncles, her sister tested negative 5 years ago, she is Charity fundraiser , too. Works first shift . her mother underwent a sleep study after falling asleep at the wheel and causing an accident.  I was able to obtain data downloads from the CPAP machine- the average daily use of CPAP at 7 hours and 29 minutes indicating very high compliance. The patient has a 99.4 % compliance 180 days. Her residual apnea hypoxia index is 0.5, there is only a mild air leak . 11 cm water had been continued with good results.Her husband has complex apnea  is on BIPAP on 14 cm water.     10-23-14 Interval: Michelle Webster reports that her husband has been of concerning health recently and that he was tentatively diagnosed with a polymyositis. He had received IV steroids and an oral taper but he failed to get much better. He is now awaiting a [facility] specialty consultation in April of this year. She reports that she has gained further weight since being married. But she has continued to be highly compliant with his CPAP and a 30 day download today reports 100% compliance over 4 hours of daily use the average user time is 7 hours and 43 minutes the average AHI is 0.5 on 11 cm water setting. This is also reflected in the low fatigue  severity score of 26 points her Epworth sleepiness score is at 13 points. Mr. Hyneman has mentioned to his wife that she goes to sleep every time they're in church.  She continues to take Glucophage, Prilosec, Colace and Celexa and vitamin D supplements she also takes prenatal vitamins upon my request. She feels sleepier and her supplies need to be ordered , she established her DME and MD connection.       REVIEW OF SYSTEMS: Out of a complete 14 system review of symptoms, the patient complains only of the following symptoms, and all other reviewed systems are negative.  Daytime sleepiness  ALLERGIES: Allergies  Allergen Reactions  . Vicodin [Hydrocodone-Acetaminophen] Itching    HOME MEDICATIONS: Outpatient Prescriptions Prior to Visit  Medication Sig Dispense Refill  . Cholecalciferol (VITAMIN D) 2000 UNITS tablet Take 2,000 Units by mouth daily. 5000 units daily    . citalopram (CELEXA) 40 MG tablet     . docusate sodium (COLACE) 100 MG capsule Take 100 mg by mouth 2 (two) times daily.    . meloxicam (MOBIC) 15 MG tablet Take 1 tablet (15 mg total) by mouth daily. 30 tablet 3  . metFORMIN (GLUCOPHAGE-XR) 500 MG 24 hr tablet Take 500 mg by mouth 2 (two) times daily.  11  . omeprazole (PRILOSEC) 20 MG capsule 40 mg.     . Prenatal Vit-Fe Fumarate-FA (PRENATAL MULTIVITAMIN) TABS tablet Take by mouth.    . Urea 40 % LOTN     . methylPREDNISolone (MEDROL) 4 MG TBPK tablet Tapering 6 day dose pack (Patient not taking: Reported on 10/26/2015) 21 tablet 0   No facility-administered medications prior to visit.    PAST MEDICAL HISTORY: Past Medical History  Diagnosis Date  . Anxiety   . Dyspnea   . Morbid obesity (HCC)   . Neurotic depression   . OSA (obstructive sleep apnea)     PAST SURGICAL HISTORY: Past Surgical History  Procedure Laterality Date  . Lum pectomy    . Gallbladder surgery      FAMILY HISTORY: Family History  Problem Relation Age of Onset  .  Hypertension Father   . Other Father     cardiac ablations x 2  . Depression Mother     ECT    SOCIAL HISTORY: Social History   Social History  . Marital Status: Married    Spouse Name: Kathlene November  . Number of Children: 0  . Years of Education: BSN   Occupational History  .  [employer]   Social History Main Topics  . Smoking status: Never Smoker   . Smokeless tobacco: Never Used  . Alcohol Use: No  . Drug Use: Not on file  . Sexual Activity: Not on file   Other  Topics Concern  . Not on file   Social History Narrative   Patient is married Kathlene November([name]) and lives at home with her husband.   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   Patient is right-handed.   Patient drinks tea (2-3 glasses ) per day.      PHYSICAL EXAM  Filed Vitals:   10/26/15 1527  BP: 125/72  Pulse: 77  Height: 5\' 5"  (1.651 m)  Weight: 294 lb (133.358 kg)   Body mass index is 48.92 kg/(m^2).  Generalized: Well developed, in no acute distress  Neck: Circumference 19-1/2 inches, Mallampati 4+  Neurological examination  Mentation: Alert oriented to time, place, history taking. Follows all commands speech and language fluent Cranial nerve II-XII: Pupils were equal round reactive to light. Extraocular movements were full, visual field were full on confrontational test. Facial sensation and strength were normal. Uvula tongue midline. Head turning and shoulder shrug  were normal and symmetric. Motor: The motor testing reveals 5 over 5 strength of all 4 extremities. Good symmetric motor tone is noted throughout.  Sensory: Sensory testing is intact to soft touch on all 4 extremities. No evidence of extinction is noted.  Coordination: Cerebellar testing reveals good finger-nose-finger and heel-to-shin bilaterally.  Gait and station: Gait is normal.  Reflexes: Deep tendon reflexes are symmetric and normal bilaterally.   DIAGNOSTIC DATA (LABS,  IMAGING, TESTING) - I reviewed patient records, labs, notes, testing and imaging myself where available.     ASSESSMENT AND PLAN 50 y.o. year old female  has a past medical history of Anxiety; Dyspnea; Morbid obesity (HCC); Neurotic depression; and OSA (obstructive sleep apnea). here with:  1. Obstructive sleep apnea on CPAP  Overall the patient is doing well. She will continue using the CPAP nightly. Patient is encouraged to monitor her diet and exercise. Patient advised that if her symptoms worsen or she develops new symptoms she should let us know. She will follow-up in one year or sooner if needed.     Butch PennyMegan Melrose Kearse, MSN, NP-C 10/26/2015, 4:13 PM [facility] [address], [address] [place], [place] [address] [phone]

## 2015-11-01 ENCOUNTER — Encounter: Payer: Self-pay | Admitting: *Deleted

## 2015-11-12 ENCOUNTER — Encounter: Payer: Self-pay | Admitting: Podiatry

## 2015-11-12 ENCOUNTER — Ambulatory Visit (INDEPENDENT_AMBULATORY_CARE_PROVIDER_SITE_OTHER): Payer: 59 | Admitting: Podiatry

## 2015-11-12 VITALS — BP 127/56 | HR 85 | Resp 16

## 2015-11-12 DIAGNOSIS — M722 Plantar fascial fibromatosis: Secondary | ICD-10-CM

## 2015-11-12 NOTE — Progress Notes (Signed)
She presents today for follow-up of her plantar fasciitis right foot. She states that is approximately 60% improved. She states that the night splint initially gave her severe cramps in her legs however it has become more comfortable recently. She states that she tends to wear her tennis shoes with her orthotics regularly. She has not iced.  Objective: Vital signs stable alert and oriented 3. Pulses are palpable. Pain on palpation medial calcaneal tubercle left.  Assessment plantar fasciitis right foot resolving.  Plan: I encouraged her to continue conservative therapies including plantar fascial brace, night splint, tennis shoes, ice therapy, and we also injected her right heel today with Kenalog and local anesthetic I will follow up with her in 1 month.

## 2015-12-21 ENCOUNTER — Other Ambulatory Visit: Payer: Self-pay

## 2015-12-21 DIAGNOSIS — Z1231 Encounter for screening mammogram for malignant neoplasm of breast: Secondary | ICD-10-CM

## 2015-12-24 ENCOUNTER — Encounter: Payer: Self-pay | Admitting: Podiatry

## 2015-12-24 ENCOUNTER — Ambulatory Visit (INDEPENDENT_AMBULATORY_CARE_PROVIDER_SITE_OTHER): Payer: 59 | Admitting: Podiatry

## 2015-12-24 VITALS — BP 130/65 | HR 82 | Resp 16

## 2015-12-24 DIAGNOSIS — M722 Plantar fascial fibromatosis: Secondary | ICD-10-CM

## 2015-12-24 NOTE — Progress Notes (Signed)
She presents today for follow-up of her plantar fasciitis to her right foot. She states that it is nearly 100% improved.  Objective: Possible signs are stable she is alert and oriented 3 she has no pain on palpation medial calcaneal tubercle of the right heel.  Assessment: Healing plantar fasciitis right.  Plan: I encouraged her to continue all of her conservative therapies until she is 100% well +1 month. She will follow-up with me with any regression.

## 2015-12-29 ENCOUNTER — Other Ambulatory Visit: Payer: Self-pay | Admitting: Gastroenterology

## 2016-01-28 ENCOUNTER — Ambulatory Visit (HOSPITAL_COMMUNITY): Admit: 2016-01-28 | Payer: 59 | Admitting: Gastroenterology

## 2016-01-28 ENCOUNTER — Encounter (HOSPITAL_COMMUNITY): Payer: Self-pay

## 2016-01-28 SURGERY — COLONOSCOPY WITH PROPOFOL
Anesthesia: Monitor Anesthesia Care

## 2016-02-01 ENCOUNTER — Ambulatory Visit
Admission: RE | Admit: 2016-02-01 | Discharge: 2016-02-01 | Disposition: A | Discharge status: Home / Self Care | Payer: 59 | Source: Ambulatory Visit

## 2016-02-01 DIAGNOSIS — Z1231 Encounter for screening mammogram for malignant neoplasm of breast: Secondary | ICD-10-CM

## 2016-02-14 ENCOUNTER — Other Ambulatory Visit: Payer: Self-pay | Admitting: Podiatry

## 2016-02-15 NOTE — Telephone Encounter (Signed)
Pt needs an appt if problem continues.  

## 2016-10-25 ENCOUNTER — Ambulatory Visit (INDEPENDENT_AMBULATORY_CARE_PROVIDER_SITE_OTHER): Payer: 59 | Admitting: Adult Health

## 2016-10-25 ENCOUNTER — Encounter: Payer: Self-pay | Admitting: Adult Health

## 2016-10-25 ENCOUNTER — Encounter (INDEPENDENT_AMBULATORY_CARE_PROVIDER_SITE_OTHER): Payer: Self-pay

## 2016-10-25 VITALS — BP 107/66 | HR 67 | Ht 65.0 in | Wt 254.2 lb

## 2016-10-25 DIAGNOSIS — Z9989 Dependence on other enabling machines and devices: Secondary | ICD-10-CM | POA: Diagnosis not present

## 2016-10-25 DIAGNOSIS — G4733 Obstructive sleep apnea (adult) (pediatric): Secondary | ICD-10-CM | POA: Diagnosis not present

## 2016-10-25 DIAGNOSIS — R400 Somnolence: Secondary | ICD-10-CM

## 2016-10-25 NOTE — Progress Notes (Signed)
PATIENT: Michelle Webster DOB: 01/09/1966  REASON FOR VISIT: follow up- OSA on cpap HISTORY FROM: patient  HISTORY OF PRESENT ILLNESS: Michelle Webster is a 51 year old female with a history of obstructive sleep apnea. She returns today for follow-up. Her compliance download indicates that she uses her machine nightly. Each night she used to greater than 4 hours. Her average usage is 7 hours and 42 minutes. Her residual AHI is 0.8 on 11 cm of water. She continues to feel that the CPAP works well for her. She states that over the last 6 weeks her sleepiness has worsened. She states that if she sits to read or watch television she usually will fall asleep fairly easily. When she was exercising and dieting regularly and this helped her sleepiness. She states that since she has stopped she feels this has increased her sleepiness. She plans to resume dieting and exercising. She returns today for an evaluation.   HISTORY 10/26/15:  Michelle Webster is a 51 year old female with a history of obstructive sleep apnea on CPAP. She returns today for her yearly compliance download. Her download indicates that she uses her machine 30 out of 30 days for compliance of 100%. She uses her machine greater than 4 hours each night. On average she uses her machine 7 hours and 30 minutes. Her residual AHI is 0.6 on 11 cm of water. She does not have a significant leak. The patient states that overall she is doing well. She has only gone one night without using the machine since she got it. She does feel that it's help with her fatigue. She states that at the end of the day when she sits down she falls asleep but she contributes this to being exhausted. She no longer falls asleep during work or driving. She returns today for an evaluation.  HISTORY 10/23/14: Michelle Webster is a 51 y.o. female Is seen here as a referral from Dr. Mathis Bud , Rolene Arbour . Her new PCP will be Dr. Zachery Dauer for new CPAP suplpies.  Michelle Webster (at that time Michelle Webster) was tested on 11/21/2008 at [facility] by Dr. Catha Gosselin.  She was diagnosed with obstructive sleep apnea with an RDI of 72 per hour and this all oxygen levels at nadir at 82% of saturation. She also was moderately snoring.  The AHI was 37 per hour indicating that she had a severe sleep apnea at the time of her sleep test her BMI was 42, CO2 had not been tested. The patient was titrated to 11 cm she also states that she has been compliant with her machine but needs new supplies. She has just moved this area from [place].  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 In preparation for her wedding ,she lost 30 pounds to 259 . She regained after her honeymoon and is today 278 pounds.   The patient reports that she pulls to bed around 2300 hours, he falls asleep fairly promptly. She will have more nocturia break at night. She rises in the morning at 5:30 AM she will need alarm. She has regular breakfast in the morning, feels that she would like to sleep longer. Her diet is caffee free, she drinks tea. Doesn't nap.  The patient is aware that she snores loudly when not using CPAP, but as far as she knows she is not snoring when on CPAP. Sometimes she would have a dry mouth in the morning. Some mornings she may have  a dull headache but she never has been awoken by headaches from sleep.She has no shift work history.Her bedroom is cool , quiet and dark. This patient has a strong family history of obesity and apnea, father and paternal aunts and uncles, her sister tested negative 5 years ago, she is Michelle Webster , too. Works first shift . her mother underwent a sleep study after falling asleep at the wheel and causing an accident.  I was able to obtain data downloads from the CPAP machine- the average daily use of CPAP at 7 hours and 29 minutes indicating very high compliance. The patient has a 99.4 % compliance 180 days. Her residual apnea hypoxia index is 0.5, there is  only a mild air leak . 11 cm water had been continued with good results.Her husband has complex apnea is on BIPAP on 14 cm water.     10-23-14 Interval: Michelle Webster reports that her husband has been of concerning health recently and that he was tentatively diagnosed with a polymyositis. He had received IV steroids and an oral taper but he failed to get much better. He is now awaiting a [facility] specialty consultation in April of this year. She reports that she has gained further weight since being married. But she has continued to be highly compliant with his CPAP and a 30 day download today reports 100% compliance over 4 hours of daily use the average user time is 7 hours and 43 minutes the average AHI is 0.5 on 11 cm water setting. This is also reflected in the low fatigue severity score of 26 points her Epworth sleepiness score is at 13 points. Michelle Webster has mentioned to his wife that she goes to sleep every time they're in church.  She continues to take Glucophage, Prilosec, Colace and Celexa and vitamin D supplements she also takes prenatal vitamins upon my request. She feels sleepier and her supplies need to be ordered , she established her DME and MD connection.       REVIEW OF SYSTEMS: Out of a complete 14 system review of symptoms, the patient complains only of the following symptoms, and all other reviewed systems are negative.  Epworth sleepiness score 10 apnea, constipation  ALLERGIES: Allergies  Allergen Reactions  . Vicodin [Hydrocodone-Acetaminophen] Itching    HOME MEDICATIONS: Outpatient Medications Prior to Visit  Medication Sig Dispense Refill  . ASTRAGALUS PO Take 1 tablet by mouth daily.    . Cholecalciferol (VITAMIN D) 2000 UNITS tablet Take 2,000 Units by mouth daily. 5000 units daily    . citalopram (CELEXA) 40 MG tablet Take 40 mg by mouth daily.     [name] docusate sodium (COLACE) 100 MG capsule Take 100 mg by mouth 2 (two) times daily.    . meloxicam  (MOBIC) 15 MG tablet TAKE 1 TABLET (15 MG TOTAL) BY MOUTH DAILY. 30 tablet 7  . metFORMIN (GLUCOPHAGE-XR) 500 MG 24 hr tablet Take 500 mg by mouth 2 (two) times daily.  11  . omeprazole (PRILOSEC) 20 MG capsule Take 20 mg by mouth 2 (two) times daily before a meal.     . polyethylene glycol (MIRALAX / GLYCOLAX) packet Take 17 g by mouth daily.     No facility-administered medications prior to visit.     PAST MEDICAL HISTORY: Past Medical History:  Diagnosis Date  . Anxiety   . Dyspnea   . Morbid obesity (HCC)   . Neurotic depression   . OSA (obstructive sleep apnea)     PAST SURGICAL  HISTORY: Past Surgical History:  Procedure Laterality Date  . GALLBLADDER SURGERY    . LUM PECTOMY      FAMILY HISTORY: Family History  Problem Relation Age of Onset  . Hypertension Father   . Other Father     cardiac ablations x 2  . Depression Mother     ECT    SOCIAL HISTORY: Social History   Social History  . Marital status: Married    Spouse name: [name]  . Number of children: 0  . Years of education: BSN   Occupational History  .  [employer]   Social History Main Topics  . Smoking status: Never Smoker  . Smokeless tobacco: Never Used  . Alcohol use No  . Drug use: Unknown  . Sexual activity: Not on file   Other Topics Concern  . Not on file   Social History Narrative   Patient is married Kathlene November([name]) and lives at home with her husband.   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   Patient is right-handed.   Patient drinks tea (2-3 glasses ) per day.      PHYSICAL EXAM  Vitals:   10/25/16 0759  Height: 5\' 5"  (1.651 m)   There is no height or weight on file to calculate BMI.  Generalized: Well developed, in no acute distress   Neurological examination  Mentation: Alert oriented to time, place, history taking. Follows all commands speech and language fluent Cranial nerve II-XII: Pupils were equal round  reactive to light. Extraocular movements were full, visual field were full on confrontational test. Facial sensation and strength were normal. Uvula tongue midline. Head turning and shoulder shrug  were normal and symmetric.Neck circumference 18 inches Mallampati 4+ Motor: The motor testing reveals 5 over 5 strength of all 4 extremities. Good symmetric motor tone is noted throughout.  Sensory: Sensory testing is intact to soft touch on all 4 extremities. No evidence of extinction is noted.  Coordination: Cerebellar testing reveals good finger-nose-finger and heel-to-shin bilaterally.  Gait and station: Gait is normal.  Reflexes: Deep tendon reflexes are symmetric and normal bilaterally.   DIAGNOSTIC DATA (LABS, IMAGING, TESTING) - I reviewed patient records, labs, notes, testing and imaging myself where available.     ASSESSMENT AND PLAN 51 y.o. year old female  has a past medical history of Anxiety; Dyspnea; Morbid obesity (HCC); Neurotic depression; and OSA (obstructive sleep apnea). here with:  1. Obstructive sleep apnea on CPAP  Overall the patient is doing well. She has excellent compliance in the treatment of her apnea. The patient is encouraged to resume exercising and monitoring her diet. If her daytime sleepiness worsen she should let us know. She will follow-up in one year or sooner if needed.    Butch PennyMegan Priest Lockridge, MSN, NP-C 10/25/2016, 8:00 AM [facility] [address], [address] [place], [place] [id number] [phone]

## 2016-10-25 NOTE — Patient Instructions (Signed)
Continue using CPAP nightly Begin exercising and dieting again If your symptoms worsen or you develop new symptoms please let us know.

## 2016-10-25 NOTE — Progress Notes (Signed)
I agree with the assessment and plan as directed by NP . Consider referral to medical weight management. The patient is known to me .   Shaquelle Hernon, MD

## 2016-12-06 ENCOUNTER — Encounter: Payer: Self-pay | Admitting: Podiatry

## 2016-12-06 ENCOUNTER — Ambulatory Visit (INDEPENDENT_AMBULATORY_CARE_PROVIDER_SITE_OTHER): Payer: 59 | Admitting: Podiatry

## 2016-12-06 ENCOUNTER — Ambulatory Visit (INDEPENDENT_AMBULATORY_CARE_PROVIDER_SITE_OTHER): Payer: 59

## 2016-12-06 DIAGNOSIS — M779 Enthesopathy, unspecified: Principal | ICD-10-CM

## 2016-12-06 DIAGNOSIS — M7751 Other enthesopathy of right foot: Secondary | ICD-10-CM

## 2016-12-06 DIAGNOSIS — M778 Other enthesopathies, not elsewhere classified: Secondary | ICD-10-CM

## 2016-12-06 MED ORDER — MELOXICAM 15 MG PO TABS: 15.0000 mg | ORAL_TABLET | Freq: Every day | ORAL | 3 refills | Status: DC

## 2016-12-06 NOTE — Progress Notes (Signed)
She presented today with a chief complaint of pain to the plantar forefoot right. States that I been having pain in the ball of my foot.  Objective: Vital signs are stable she is alert and oriented 3. Pulses are palpable. She has pain on end range of motion the second metatarsophalangeal right foot. Radiographs do demonstrate elongated second metatarsal which was demonstrated to her today.  Assessment: Plantar flexed elongated second metatarsal capsulitis forefoot right.  Plan: I injected second metatarsophalangeal joint with dexamethasone and local anesthetic today after Betadine skin prep. Also refilled her meloxicam. We'll follow up with her in 1 month. Discussed appropriate shoe gear shows excise and ice therapy.

## 2017-01-17 ENCOUNTER — Ambulatory Visit: Payer: 59 | Admitting: Podiatry

## 2017-02-17 ENCOUNTER — Other Ambulatory Visit: Payer: Self-pay | Admitting: Obstetrics

## 2017-02-17 DIAGNOSIS — Z1231 Encounter for screening mammogram for malignant neoplasm of breast: Secondary | ICD-10-CM

## 2017-02-23 ENCOUNTER — Other Ambulatory Visit: Payer: Self-pay | Admitting: Obstetrics and Gynecology

## 2017-02-23 DIAGNOSIS — Z1231 Encounter for screening mammogram for malignant neoplasm of breast: Secondary | ICD-10-CM

## 2017-03-07 ENCOUNTER — Ambulatory Visit
Admission: RE | Admit: 2017-03-07 | Discharge: 2017-03-07 | Disposition: A | Discharge status: Home / Self Care | Payer: 59 | Source: Ambulatory Visit | Attending: Obstetrics | Admitting: Obstetrics

## 2017-03-07 ENCOUNTER — Encounter: Payer: Self-pay | Admitting: Radiology

## 2017-03-07 DIAGNOSIS — Z1231 Encounter for screening mammogram for malignant neoplasm of breast: Secondary | ICD-10-CM

## 2017-10-31 ENCOUNTER — Ambulatory Visit: Payer: 59 | Admitting: Adult Health

## 2017-10-31 ENCOUNTER — Encounter: Payer: Self-pay | Admitting: Adult Health

## 2017-10-31 VITALS — BP 118/70 | HR 74 | Ht 65.0 in | Wt 288.0 lb

## 2017-10-31 DIAGNOSIS — G4733 Obstructive sleep apnea (adult) (pediatric): Secondary | ICD-10-CM | POA: Diagnosis not present

## 2017-10-31 DIAGNOSIS — Z9989 Dependence on other enabling machines and devices: Secondary | ICD-10-CM | POA: Diagnosis not present

## 2017-10-31 DIAGNOSIS — Z6841 Body Mass Index (BMI) 40.0 and over, adult: Secondary | ICD-10-CM | POA: Diagnosis not present

## 2017-10-31 NOTE — Progress Notes (Signed)
I agree with the assessment and plan as directed by NP .The patient is known to me .   Lucresha Dismuke, MD  

## 2017-10-31 NOTE — Progress Notes (Signed)
PATIENT: Michelle HaileyJulie Pablo DOB: 05/31/1966  REASON FOR VISIT: follow up HISTORY FROM: patient  HISTORY OF PRESENT ILLNESS: Today 10/31/17 Michelle Webster is a 52 year old female with a history of obstructive sleep apnea on CPAP.  He returns today for follow-up.  Her CPAP download indicates that she use her machine nightly and greater than 4 hours each night.  Her average usage is 8 hours and 14 minutes.  Her residual AHI is 0.3 on 11 cm of water.  She does not have a significant leak.  She reports that the CPAP continues to work well for her.  She states that in December she was diagnosed with viral meningitis.  Reports that she spent 6 days in the hospital and was out of work for approximately 2 weeks.  She states that since that time she has gained weight.  She reports that she feels that this is contributing to her sleepiness and fatigue.  She reports that she currently has a sinus infection and has been seen by her primary care provider.  She returns today for evaluation.  HISTORY 10/25/16: Michelle Webster is a 52 year old female with a history of obstructive sleep apnea. She returns today for follow-up. Her compliance download indicates that she uses her machine nightly. Each night she used to greater than 4 hours. Her average usage is 7 hours and 42 minutes. Her residual AHI is 0.8 on 11 cm of water. She continues to feel that the CPAP works well for her. She states that over the last 6 weeks her sleepiness has worsened. She states that if she sits to read or watch television she usually will fall asleep fairly easily. When she was exercising and dieting regularly and this helped her sleepiness. She states that since she has stopped she feels this has increased her sleepiness. She plans to resume dieting and exercising. She returns today for an evaluation.    REVIEW OF SYSTEMS: Out of a complete 14 system review of symptoms, the patient complains only of the following symptoms, and all other reviewed systems  are negative.  Epworth sleepiness score 11, fatigue severity score 38  Fatigue  ALLERGIES: Allergies  Allergen Reactions  . Vicodin [Hydrocodone-Acetaminophen] Itching    HOME MEDICATIONS: Outpatient Medications Prior to Visit  Medication Sig Dispense Refill  . ASTRAGALUS PO Take 1 tablet by mouth daily.    [name]. b complex vitamins tablet Take 1 tablet by mouth daily.    . citalopram (CELEXA) 40 MG tablet Take 40 mg by mouth daily.     [name]. docusate sodium (COLACE) 100 MG capsule Take 100 mg by mouth 2 (two) times daily.    . fluticasone (FLONASE) 50 MCG/ACT nasal spray Place 2 sprays into both nostrils daily.    [name]. linaclotide (LINZESS) 290 MCG CAPS capsule Take 290 mcg by mouth daily before breakfast.    . metFORMIN (GLUCOPHAGE-XR) 500 MG 24 hr tablet Take 500 mg by mouth 2 (two) times daily.  11  . omeprazole (PRILOSEC) 20 MG capsule Take 20 mg by mouth 2 (two) times daily before a meal.     . OVER THE COUNTER MEDICATION Tumeric daily    . meloxicam (MOBIC) 15 MG tablet Take 1 tablet (15 mg total) by mouth daily. 30 tablet 3  . Vitamin D, Ergocalciferol, (DRISDOL) 50000 units CAPS capsule Take 50,000 Units by mouth every 7 (seven) days.      No facility-administered medications prior to visit.     PAST MEDICAL HISTORY: Past Medical History:  Diagnosis Date  . Anxiety   . Dyspnea   . Morbid obesity (HCC)   . Neurotic depression   . OSA (obstructive sleep apnea)     PAST SURGICAL HISTORY: Past Surgical History:  Procedure Laterality Date  . BREAST EXCISIONAL BIOPSY Right   . GALLBLADDER SURGERY    . LUM PECTOMY      FAMILY HISTORY: Family History  Problem Relation Age of Onset  . Depression Mother        ECT  . Hypertension Father   . Other Father        cardiac ablations x 2    SOCIAL HISTORY: Social History   Socioeconomic History  . Marital status: Married    Spouse name: Kathlene November  . Number of children: 0  . Years of education: BSN  . Highest education level:  Not on file  Occupational History    Employer: [employer]  Social Needs  . Financial resource strain: Not on file  . Food insecurity:    Worry: Not on file    Inability: Not on file  . Transportation needs:    Medical: Not on file    Non-medical: Not on file  Tobacco Use  . Smoking status: Never Smoker  . Smokeless tobacco: Never Used  Substance and Sexual Activity  . Alcohol use: No  . Drug use: Not on file  . Sexual activity: Not on file  Lifestyle  . Physical activity:    Days per week: Not on file    Minutes per session: Not on file  . Stress: Not on file  Relationships  . Social connections:    Talks on phone: Not on file    Gets together: Not on file    Attends religious service: Not on file    Active member of club or organization: Not on file    Attends meetings of clubs or organizations: Not on file    Relationship status: Not on file  . Intimate partner violence:    Fear of current or ex partner: Not on file    Emotionally abused: Not on file    Physically abused: Not on file    Forced sexual activity: Not on file  Other Topics Concern  . Not on file  Social History Narrative   Patient is married Kathlene November) and lives at home with her husband.   Patient is a Charity fundraiser, works at [employer].   Patient has a Barista.   Patient is right-handed.   Patient drinks tea (2-3 glasses ) per day.      PHYSICAL EXAM  Vitals:   10/31/17 0713  BP: 118/70  Pulse: 74  Weight: 288 lb (130.6 kg)  Height: 5\' 5"  (1.651 m)   Body mass index is 47.93 kg/m.  Generalized: Well developed, in no acute distress   Neurological examination  Mentation: Alert oriented to time, place, history taking. Follows all commands speech and language fluent Cranial nerve II-XII: Pupils were equal round reactive to light. Extraocular movements were full, visual field were full on confrontational test. Facial sensation and strength were normal. Uvula tongue  midline. Head turning and shoulder shrug  were normal and symmetric.  Neck circumference 18-1/2 inches, Mallampati 3+ Motor: The motor testing reveals 5 over 5 strength of all 4 extremities. Good symmetric motor tone is noted throughout.  Sensory: Sensory testing is intact to soft touch on all 4 extremities. No evidence of extinction is noted.  Coordination: Cerebellar testing reveals good  finger-nose-finger and heel-to-shin bilaterally.  Gait and station: Gait is normal.    DIAGNOSTIC DATA (LABS, IMAGING, TESTING) - I reviewed patient records, labs, notes, testing and imaging myself where available.    ASSESSMENT AND PLAN 52 y.o. year old female  has a past medical history of Anxiety, Dyspnea, Morbid obesity (HCC), Neurotic depression, and OSA (obstructive sleep apnea). here with:  1.  Obstructive sleep apnea on CPAP 2.  Obesity  Overall the patient CPAP download shows excellent compliance and good treatment of her apnea.  She is encouraged to continue using CPAP nightly.  I did refer the patient to [facility].  Patient verbalized appreciation.  He is advised that if her symptoms worsen or she develops new symptoms she should let us know.  She will follow-up in 1 year or sooner if needed.      Butch Penny, MSN, NP-C 10/31/2017, 7:32 AM [facility] [address], [address] [place], [place] [address] [phone]

## 2017-10-31 NOTE — Patient Instructions (Signed)
Your Plan:  Continue using CPAP nightly Referral sent to weight management If your symptoms worsen or you develop new symptoms please let us know.   Thank you for coming to see [facility] at [facility]. I hope we have been able to provide you high quality care today.  You may receive a patient satisfaction survey over the next few weeks. We would appreciate your feedback and comments so that we may continue to improve ourselves and the health of our patients.

## 2017-12-14 ENCOUNTER — Encounter (INDEPENDENT_AMBULATORY_CARE_PROVIDER_SITE_OTHER): Payer: 59

## 2017-12-18 ENCOUNTER — Ambulatory Visit (INDEPENDENT_AMBULATORY_CARE_PROVIDER_SITE_OTHER): Payer: 59 | Admitting: Family Medicine

## 2017-12-19 ENCOUNTER — Encounter (INDEPENDENT_AMBULATORY_CARE_PROVIDER_SITE_OTHER): Payer: Self-pay | Admitting: Family Medicine

## 2017-12-19 ENCOUNTER — Ambulatory Visit (INDEPENDENT_AMBULATORY_CARE_PROVIDER_SITE_OTHER): Payer: 59 | Admitting: Family Medicine

## 2017-12-19 VITALS — BP 115/71 | HR 65 | Temp 98.2°F | Ht 65.0 in | Wt 280.0 lb

## 2017-12-19 DIAGNOSIS — Z1331 Encounter for screening for depression: Secondary | ICD-10-CM | POA: Diagnosis not present

## 2017-12-19 DIAGNOSIS — Z9189 Other specified personal risk factors, not elsewhere classified: Secondary | ICD-10-CM | POA: Diagnosis not present

## 2017-12-19 DIAGNOSIS — R0602 Shortness of breath: Secondary | ICD-10-CM | POA: Diagnosis not present

## 2017-12-19 DIAGNOSIS — E559 Vitamin D deficiency, unspecified: Secondary | ICD-10-CM | POA: Diagnosis not present

## 2017-12-19 DIAGNOSIS — E66813 Obesity, class 3: Secondary | ICD-10-CM

## 2017-12-19 DIAGNOSIS — Z0289 Encounter for other administrative examinations: Secondary | ICD-10-CM

## 2017-12-19 DIAGNOSIS — R5383 Other fatigue: Secondary | ICD-10-CM

## 2017-12-19 DIAGNOSIS — R7303 Prediabetes: Secondary | ICD-10-CM | POA: Diagnosis not present

## 2017-12-19 DIAGNOSIS — Z6841 Body Mass Index (BMI) 40.0 and over, adult: Secondary | ICD-10-CM

## 2017-12-20 LAB — VITAMIN D 25 HYDROXY (VIT D DEFICIENCY, FRACTURES): Vit D, 25-Hydroxy: 34.5 ng/mL (ref 30.0–100.0)

## 2017-12-20 LAB — INSULIN, RANDOM: INSULIN: 31.1 u[IU]/mL — ABNORMAL HIGH (ref 2.6–24.9)

## 2017-12-20 LAB — HEMOGLOBIN A1C
ESTIMATED AVERAGE GLUCOSE: 123 mg/dL
HEMOGLOBIN A1C: 5.9 % — AB (ref 4.8–5.6)

## 2017-12-20 LAB — COMPREHENSIVE METABOLIC PANEL
A/G RATIO: 1.7 (ref 1.2–2.2)
ALK PHOS: 81 IU/L (ref 39–117)
ALT: 17 IU/L (ref 0–32)
AST: 18 IU/L (ref 0–40)
Albumin: 4.3 g/dL (ref 3.5–5.5)
BUN/Creatinine Ratio: 15 (ref 9–23)
BUN: 11 mg/dL (ref 6–24)
Bilirubin Total: 0.3 mg/dL (ref 0.0–1.2)
CALCIUM: 9.6 mg/dL (ref 8.7–10.2)
CO2: 20 mmol/L (ref 20–29)
CREATININE: 0.72 mg/dL (ref 0.57–1.00)
Chloride: 104 mmol/L (ref 96–106)
GFR calc Af Amer: 114 mL/min/{1.73_m2} (ref >59)
GFR, EST NON AFRICAN AMERICAN: 99 mL/min/{1.73_m2} (ref >59)
Globulin, Total: 2.6 g/dL (ref 1.5–4.5)
Glucose: 89 mg/dL (ref 65–99)
POTASSIUM: 4.5 mmol/L (ref 3.5–5.2)
Sodium: 141 mmol/L (ref 134–144)
Total Protein: 6.9 g/dL (ref 6.0–8.5)

## 2017-12-20 LAB — CBC WITH DIFFERENTIAL
Basophils Absolute: 0 10*3/uL (ref 0.0–0.2)
Basos: 1 %
EOS (ABSOLUTE): 0.1 10*3/uL (ref 0.0–0.4)
Eos: 1 %
Hematocrit: 38.2 % (ref 34.0–46.6)
Hemoglobin: 12.5 g/dL (ref 11.1–15.9)
IMMATURE GRANULOCYTES: 0 %
Immature Grans (Abs): 0 10*3/uL (ref 0.0–0.1)
Lymphocytes Absolute: 1.9 10*3/uL (ref 0.7–3.1)
Lymphs: 29 %
MCH: 27.5 pg (ref 26.6–33.0)
MCHC: 32.7 g/dL (ref 31.5–35.7)
MCV: 84 fL (ref 79–97)
MONOS ABS: 0.3 10*3/uL (ref 0.1–0.9)
Monocytes: 5 %
NEUTROS PCT: 64 %
Neutrophils Absolute: 4.1 10*3/uL (ref 1.4–7.0)
RBC: 4.54 x10E6/uL (ref 3.77–5.28)
RDW: 14.3 % (ref 12.3–15.4)
WBC: 6.5 10*3/uL (ref 3.4–10.8)

## 2017-12-20 LAB — LIPID PANEL WITH LDL/HDL RATIO
CHOLESTEROL TOTAL: 194 mg/dL (ref 100–199)
HDL: 39 mg/dL — ABNORMAL LOW (ref >39)
LDL CALC: 106 mg/dL — AB (ref 0–99)
LDL/HDL RATIO: 2.7 ratio (ref 0.0–3.2)
TRIGLYCERIDES: 244 mg/dL — AB (ref 0–149)
VLDL CHOLESTEROL CAL: 49 mg/dL — AB (ref 5–40)

## 2017-12-20 LAB — TSH: TSH: 3.61 u[IU]/mL (ref 0.450–4.500)

## 2017-12-20 LAB — T3: T3, Total: 136 ng/dL (ref 71–180)

## 2017-12-20 LAB — VITAMIN B12: VITAMIN B 12: 472 pg/mL (ref 232–1245)

## 2017-12-20 LAB — FOLATE

## 2017-12-20 LAB — T4, FREE: FREE T4: 0.86 ng/dL (ref 0.82–1.77)

## 2017-12-20 NOTE — Progress Notes (Signed)
Office: [phone]  /  Fax: [phone]   Dear Dr. Vickey Huger,   Thank you for referring Michelle Webster to our clinic. The following note includes my evaluation and treatment recommendations.  HPI:   Chief Complaint: OBESITY    Michelle Webster has been referred by [name], MD for consultation regarding her obesity and obesity related comorbidities.    Michelle Webster (MR# [id number]) is a 52 y.o. female who presents on 12/19/2017 for obesity evaluation and treatment. Current BMI is Body mass index is 46.59 kg/m.Michelle Webster has been struggling with her weight for many years and has been unsuccessful in either losing weight, maintaining weight loss, or reaching her healthy weight goal.     Michelle Webster attended our information session and states she is currently in the action stage of change and ready to dedicate time achieving and maintaining a healthier weight. Michelle Webster is interested in becoming our patient and working on intensive lifestyle modifications including (but not limited to) diet, exercise and weight loss.    Michelle Webster states her family eats meals together she thinks her family will eat healthier with  her her desired weight loss is 105 lbs she has been heavy most of  her life she started gaining weight last 10 years her heaviest weight ever was 300 lbs she has significant food cravings issues  she snacks frequently in the evenings she is frequently drinking liquids with calories she frequently makes poor food choices she has problems with excessive hunger  she frequently eats larger portions than normal  she struggles with emotional eating    Fatigue Michelle Webster feels her energy is lower than it should be. This has worsened with weight gain and has not worsened recently. Michelle Webster admits to daytime somnolence and  admits to waking up still tired. Patient has a history of obstructive sleep apnea with the use of CPAP. Patent has a history of symptoms of daytime fatigue. Patient generally gets 7 hours  of sleep per night, and states they generally have generally restful sleep. Snoring is present. Apneic episodes are not present. Epworth Sleepiness Score is 13.  Dyspnea on exertion Michelle Webster notes increasing shortness of breath with exercising and seems to be worsening over time with weight gain. She notes getting out of breath sooner with activity than she used to. This has not gotten worse recently. Michelle Webster denies orthopnea.  Pre-Diabetes Michelle Webster has a diagnosis of pre-diabetes based on her elevated Hgb A1c and was informed this puts her at greater risk of developing diabetes. She is on metformin and notes polyphagia, would like to work on diet control and exercise to decrease risk of diabetes. She denies nausea or hypoglycemia.  At risk for diabetes Michelle Webster is at higher than average risk for developing diabetes due to her obesity and pre-diabetes. She currently denies polyuria or polydipsia.  Vitamin D Deficiency Michelle Webster has a diagnosis of vitamin D deficiency. She is on OTC Vit D, she notes fatigue and denies nausea, vomiting or muscle weakness.  Depression Screen Michelle Webster's Food and Mood (modified PHQ-9) score was  Depression screen PHQ 2/9 12/19/2017  Decreased Interest 3  Down, Depressed, Hopeless 2  PHQ - 2 Score 5  Altered sleeping 1  Tired, decreased energy 3  Change in appetite 2  Feeling bad or failure about yourself  1  Trouble concentrating 0  Moving slowly or fidgety/restless 0  Suicidal thoughts 0  PHQ-9 Score 12  Difficult doing work/chores Somewhat difficult    ALLERGIES: Allergies  Allergen Reactions  . Vicodin [Hydrocodone-Acetaminophen]  Itching    MEDICATIONS: Current Outpatient Medications on File Prior to Visit  Medication Sig Dispense Refill  . ASTRAGALUS PO Take 1 tablet by mouth daily.    [name] b complex vitamins tablet Take 1 tablet by mouth daily.    . cetirizine (ZYRTEC) 10 MG tablet Take 10 mg by mouth daily.    . Cholecalciferol (VITAMIN D3) 5000 units CAPS  Take 1 capsule by mouth daily.    . citalopram (CELEXA) 40 MG tablet Take 40 mg by mouth daily.     [name] docusate sodium (COLACE) 100 MG capsule Take 100 mg by mouth 2 (two) times daily.    . fluticasone (FLONASE) 50 MCG/ACT nasal spray Place 2 sprays into both nostrils daily.    [name] linaclotide (LINZESS) 290 MCG CAPS capsule Take 290 mcg by mouth daily before breakfast.    . metFORMIN (GLUCOPHAGE-XR) 500 MG 24 hr tablet Take 500 mg by mouth 2 (two) times daily.  11  . omeprazole (PRILOSEC) 20 MG capsule Take 20 mg by mouth 2 (two) times daily before a meal.     . OVER THE COUNTER MEDICATION Tumeric daily     No current facility-administered medications on file prior to visit.     PAST MEDICAL HISTORY: Past Medical History:  Diagnosis Date  . Anxiety   . Back pain   . Constipation   . Dyspnea   . Eczema   . Fatty liver   . GERD (gastroesophageal reflux disease)   . HLD (hyperlipidemia)   . IBS (irritable bowel syndrome)   . Joint pain   . Morbid obesity (HCC)   . Neurotic depression   . OSA (obstructive sleep apnea)   . Palpitations   . Prediabetes   . Vitamin D deficiency     PAST SURGICAL HISTORY: Past Surgical History:  Procedure Laterality Date  . BREAST EXCISIONAL BIOPSY Right   . GALLBLADDER SURGERY    . LUM PECTOMY      SOCIAL HISTORY: Social History   Tobacco Use  . Smoking status: Never Smoker  . Smokeless tobacco: Never Used  Substance Use Topics  . Alcohol use: No  . Drug use: Not on file    FAMILY HISTORY: Family History  Problem Relation Age of Onset  . Depression Mother        ECT  . Hypertension Mother   . Hyperlipidemia Mother   . Anxiety disorder Mother   . Obesity Mother   . Hypertension Father   . Other Father        cardiac ablations x 2  . Hyperlipidemia Father   . Depression Father   . Anxiety disorder Father   . Sleep apnea Father   . Obesity Father     ROS: Review of Systems  Constitutional: Positive for malaise/fatigue.  Negative for weight loss.  HENT:       + Nasal stuffiness  Eyes:       + Wear glasses or contacts  Respiratory: Positive for shortness of breath (with exertion).   Gastrointestinal: Positive for constipation. Negative for nausea and vomiting.  Genitourinary: Negative for frequency.  Musculoskeletal:       Negative muscle weakness + Muscle or joint pain + Muscle stiffness  Endo/Heme/Allergies: Negative for polydipsia.       Positive polyphagia Negative hypoglycemia    PHYSICAL EXAM: Blood pressure 115/71, pulse 65, temperature 98.2 F (36.8 C), temperature source Oral, height  (1.651 m), weight 280 lb (127 kg), last menstrual period 11/10/2017,  SpO2 99 %. Body mass index is 46.59 kg/m. Physical Exam  Constitutional: She is oriented to person, place, and time. She appears well-developed and well-nourished.  HENT:  Head: Normocephalic and atraumatic.  Nose: Nose normal.  Eyes: EOM are normal. No scleral icterus.  Neck: Normal range of motion. Neck supple. No thyromegaly present.  Cardiovascular: Normal rate and regular rhythm.  Pulmonary/Chest: Effort normal. No respiratory distress.  Abdominal: Soft. There is no tenderness.  + Obesity  Musculoskeletal:  Range of Motion normal in all 4 extremities Trace edema noted in bilateral lower extremities  Neurological: She is alert and oriented to person, place, and time. Coordination normal.  Skin: Skin is warm and dry.  + skin tags at neck  Psychiatric: She has a normal mood and affect. Her behavior is normal.  Vitals reviewed.   RECENT LABS AND TESTS: BMET    Component Value Date/Time   NA 141 12/19/2017 0952   K 4.5 12/19/2017 0952   CL 104 12/19/2017 0952   CO2 20 12/19/2017 0952   GLUCOSE 89 12/19/2017 0952   BUN 11 12/19/2017 0952   CREATININE 0.72 12/19/2017 0952   CALCIUM 9.6 12/19/2017 0952   GFRNONAA 99 12/19/2017 0952   GFRAA 114 12/19/2017 0952   Lab Results  Component Value Date   HGBA1C 5.9 (H)  12/19/2017   Lab Results  Component Value Date   INSULIN 31.1 (H) 12/19/2017   CBC    Component Value Date/Time   WBC 6.5 12/19/2017 0952   RBC 4.54 12/19/2017 0952   HGB 12.5 12/19/2017 0952   HCT 38.2 12/19/2017 0952   MCV 84 12/19/2017 0952   MCH 27.5 12/19/2017 0952   MCHC 32.7 12/19/2017 0952   RDW 14.3 12/19/2017 0952   LYMPHSABS 1.9 12/19/2017 0952   EOSABS 0.1 12/19/2017 0952   BASOSABS 0.0 12/19/2017 0952   Iron/TIBC/Ferritin/ %Sat No results found for: IRON, TIBC, FERRITIN, IRONPCTSAT Lipid Panel     Component Value Date/Time   CHOL 194 12/19/2017 0952   TRIG 244 (H) 12/19/2017 0952   HDL 39 (L) 12/19/2017 0952   LDLCALC 106 (H) 12/19/2017 0952   Hepatic Function Panel     Component Value Date/Time   PROT 6.9 12/19/2017 0952   ALBUMIN 4.3 12/19/2017 0952   AST 18 12/19/2017 0952   ALT 17 12/19/2017 0952   ALKPHOS 81 12/19/2017 0952   BILITOT 0.3 12/19/2017 0952      Component Value Date/Time   TSH 3.610 12/19/2017 0952    ECG  shows NSR with a rate of 65 BPM INDIRECT CALORIMETER done today shows a VO2 of 278 and a REE of 1935.  Her calculated basal metabolic rate is 1610 thus her basal metabolic rate is worse than expected.    ASSESSMENT AND PLAN: Other fatigue - Plan: EKG 12-Lead, Vitamin B12, CBC With Differential, Folate, T3, T4, free, TSH, Lipid Panel With LDL/HDL Ratio  Shortness of breath on exertion - Plan: CBC With Differential  Prediabetes - Plan: Comprehensive metabolic panel, Hemoglobin A1c, Insulin, random  Vitamin D deficiency - Plan: VITAMIN D 25 Hydroxy (Vit-D Deficiency, Fractures)  Depression screening  At risk for diabetes mellitus  Class 3 severe obesity with serious comorbidity and body mass index (BMI) of 45.0 to 49.9 in adult, unspecified obesity type (HCC)  PLAN:  Fatigue Zamora was informed that her fatigue may be related to obesity, depression or many other causes. Labs will be ordered, and in the meanwhile Lawana  has agreed to work  on diet, exercise and weight loss to help with fatigue. Proper sleep hygiene was discussed including the need for 7-8 hours of quality sleep each night. A sleep study was not ordered based on symptoms and Epworth score.  Dyspnea on exertion Takera's shortness of breath appears to be obesity related and exercise induced. She has agreed to work on weight loss and gradually increase exercise to treat her exercise induced shortness of breath. If Ciearra follows our instructions and loses weight without improvement of her shortness of breath, we will plan to refer to pulmonology. We will monitor this condition regularly. Fermina agrees to this plan.  Pre-Diabetes Carolie will start diet and continue to work on weight loss, exercise, and decreasing simple carbohydrates in her diet to help decrease the risk of diabetes. We dicussed metformin including benefits and risks. She was informed that eating too many simple carbohydrates or too many calories at one sitting increases the likelihood of GI side effects. Azani agrees to continue taking metformin, we will check labs, and Makenly agrees to follow up with our clinic in 2 weeks as directed to monitor her progress.  Diabetes risk counselling Mayme was given extended (15 minutes) diabetes prevention counseling today. She is 52 y.o. female and has risk factors for diabetes including obesity and pre-diabetes. We discussed intensive lifestyle modifications today with an emphasis on weight loss as well as increasing exercise and decreasing simple carbohydrates in her diet.  Vitamin D Deficiency Janequa was informed that low vitamin D levels contributes to fatigue and are associated with obesity, breast, and colon cancer. Sherissa agrees to continue taking OTC Vit D and will follow up for routine testing of vitamin D, at least 2-3 times per year. She was informed of the risk of over-replacement of vitamin D and agrees to not increase her dose unless she  discusses this with [place] first. We will check labs and Bethzaida agrees to follow up with our clinic in 2 weeks.  Depression Screen Inari had a moderately positive depression screening. Depression is commonly associated with obesity and often results in emotional eating behaviors. We will monitor this closely and work on CBT to help improve the non-hunger eating patterns. Referral to Psychology may be required if no improvement is seen as she continues in our clinic.  Obesity Jadia is currently in the action stage of change and her goal is to continue with weight loss efforts. I recommend Myrla begin the structured treatment plan as follows:  She has agreed to follow the Category 3 plan Ellsie has been instructed to eventually work up to a goal of 150 minutes of combined cardio and strengthening exercise per week for weight loss and overall health benefits. We discussed the following Behavioral Modification Strategies today: increasing lean protein intake, decreasing simple carbohydrates  and work on meal planning and easy cooking plans   She was informed of the importance of frequent follow up visits to maximize her success with intensive lifestyle modifications for her multiple health conditions. She was informed we would discuss her lab results at her next visit unless there is a critical issue that needs to be addressed sooner. Jackelynn agreed to keep her next visit at the agreed upon time to discuss these results.    OBESITY BEHAVIORAL INTERVENTION VISIT  Today's visit was # 1 out of 22.  Starting weight: 280 lbs Starting date: 12/19/17 Today's weight : 280 lbs  Today's date: 12/19/2017 Total lbs lost to date: 0 (Patients must lose 7 lbs in the  first 6 months to continue with counseling)   ASK: We discussed the diagnosis of obesity with Amalia Hailey today and Anslie agreed to give [place] permission to discuss obesity behavioral modification therapy today.  ASSESS: Kalana has the diagnosis of  obesity and her BMI today is 46.59 Gwendlyon is in the action stage of change   ADVISE: Anjel was educated on the multiple health risks of obesity as well as the benefit of weight loss to improve her health. She was advised of the need for long term treatment and the importance of lifestyle modifications.  AGREE: Multiple dietary modification options and treatment options were discussed and  Hanni agreed to the above obesity treatment plan.   I, Burt Knack, am acting as transcriptionist for Quillian Quince, MD   I have reviewed the above documentation for accuracy and completeness, and I agree with the above. -Quillian Quince, MD

## 2018-01-03 ENCOUNTER — Ambulatory Visit (INDEPENDENT_AMBULATORY_CARE_PROVIDER_SITE_OTHER): Payer: 59 | Admitting: Family Medicine

## 2018-01-03 VITALS — BP 108/69 | HR 58 | Temp 97.6°F | Ht 65.0 in | Wt 271.0 lb

## 2018-01-03 DIAGNOSIS — Z6841 Body Mass Index (BMI) 40.0 and over, adult: Secondary | ICD-10-CM | POA: Diagnosis not present

## 2018-01-03 DIAGNOSIS — E559 Vitamin D deficiency, unspecified: Secondary | ICD-10-CM

## 2018-01-03 DIAGNOSIS — R7303 Prediabetes: Secondary | ICD-10-CM

## 2018-01-03 DIAGNOSIS — Z9189 Other specified personal risk factors, not elsewhere classified: Secondary | ICD-10-CM

## 2018-01-03 DIAGNOSIS — E7849 Other hyperlipidemia: Secondary | ICD-10-CM

## 2018-01-03 MED ORDER — VITAMIN D (ERGOCALCIFEROL) 1.25 MG (50000 UNIT) PO CAPS: 50000.0000 [IU] | ORAL_CAPSULE | ORAL | 0 refills | Status: DC

## 2018-01-04 NOTE — Progress Notes (Signed)
Office: [phone]  /  Fax: [phone]   HPI:   Chief Complaint: OBESITY Michelle Webster is here to discuss her progress with her obesity treatment plan. She is on the Category 3 plan and is following her eating plan approximately 90 % of the time. She states she is lifting weights at gym, moving more, ans stairs at work for 15 minutes 2-3 times per week. Michelle Webster did well with weight loss on her Category 3 plan. She noted excessive hunger in the afternoons. She deviated from her plan somewhat.  Her weight is 271 lb (122.9 kg) today and has had a weight loss of 9 pounds over a period of 2 weeks since her last visit. She has lost 9 lbs since starting treatment with [place].  Hyperlipidemia Michelle Webster has hyperlipidemia and has been attempting to diet control to improve her cholesterol levels with intensive lifestyle modification including a low saturated fat diet, exercise and weight loss. She is not on statin and denies any chest pain, claudication or myalgias.  Vitamin D Deficiency Michelle Webster has a diagnosis of vitamin D deficiency. She is not on Vit D prescription, not yet at goal. She denies nausea, vomiting or muscle weakness.  Pre-Diabetes Michelle Webster has a diagnosis of pre-diabetes based on her elevated Hgb A1c and was informed this puts her at greater risk of developing diabetes. She is doing well on diet prescription, stable on metformin. She notes polyphagia improved but still present. She continues to work on diet and exercise to decrease risk of diabetes. She denies nausea or hypoglycemia.  At risk for diabetes Michelle Webster is at higher than average risk for developing diabetes due to her obesity and pre-diabetes. She currently denies polyuria or polydipsia.  ALLERGIES: Allergies  Allergen Reactions  . Vicodin [Hydrocodone-Acetaminophen] Itching    MEDICATIONS: Current Outpatient Medications on File Prior to Visit  Medication Sig Dispense Refill  . ASTRAGALUS PO Take 1 tablet by mouth daily.    [name] b complex  vitamins tablet Take 1 tablet by mouth daily.    . cetirizine (ZYRTEC) 10 MG tablet Take 10 mg by mouth daily.    . citalopram (CELEXA) 40 MG tablet Take 40 mg by mouth daily.     [name] docusate sodium (COLACE) 100 MG capsule Take 100 mg by mouth 2 (two) times daily.    . fluticasone (FLONASE) 50 MCG/ACT nasal spray Place 2 sprays into both nostrils daily.    [name] linaclotide (LINZESS) 290 MCG CAPS capsule Take 290 mcg by mouth daily before breakfast.    . metFORMIN (GLUCOPHAGE-XR) 500 MG 24 hr tablet Take 500 mg by mouth 2 (two) times daily.  11  . omeprazole (PRILOSEC) 20 MG capsule Take 20 mg by mouth 2 (two) times daily before a meal.     . OVER THE COUNTER MEDICATION Tumeric daily     No current facility-administered medications on file prior to visit.     PAST MEDICAL HISTORY: Past Medical History:  Diagnosis Date  . Anxiety   . Back pain   . Constipation   . Dyspnea   . Eczema   . Fatty liver   . GERD (gastroesophageal reflux disease)   . HLD (hyperlipidemia)   . IBS (irritable bowel syndrome)   . Joint pain   . Morbid obesity (HCC)   . Neurotic depression   . OSA (obstructive sleep apnea)   . Palpitations   . Prediabetes   . Vitamin D deficiency     PAST SURGICAL HISTORY: Past Surgical History:  Procedure Laterality Date  . BREAST EXCISIONAL BIOPSY Right   . GALLBLADDER SURGERY    . LUM PECTOMY      SOCIAL HISTORY: Social History   Tobacco Use  . Smoking status: Never Smoker  . Smokeless tobacco: Never Used  Substance Use Topics  . Alcohol use: No  . Drug use: Not on file    FAMILY HISTORY: Family History  Problem Relation Age of Onset  . Depression Mother        ECT  . Hypertension Mother   . Hyperlipidemia Mother   . Anxiety disorder Mother   . Obesity Mother   . Hypertension Father   . Other Father        cardiac ablations x 2  . Hyperlipidemia Father   . Depression Father   . Anxiety disorder Father   . Sleep apnea Father   . Obesity Father       ROS: Review of Systems  Constitutional: Positive for weight loss.  Cardiovascular: Negative for chest pain and claudication.  Gastrointestinal: Negative for nausea and vomiting.  Genitourinary: Negative for frequency.  Musculoskeletal: Negative for myalgias.       Negative muscle weakness  Endo/Heme/Allergies: Negative for polydipsia.       Positive polyphagia Negative hypoglycemia    PHYSICAL EXAM: Blood pressure 108/69, pulse (!) 58, temperature 97.6 F (36.4 C), temperature source Oral, height  (1.651 m), weight 271 lb (122.9 kg), SpO2 98 %. Body mass index is 45.1 kg/m. Physical Exam  Constitutional: She is oriented to person, place, and time. She appears well-developed and well-nourished.  Cardiovascular: Normal rate.  Pulmonary/Chest: Effort normal.  Musculoskeletal: Normal range of motion.  Neurological: She is oriented to person, place, and time.  Skin: Skin is warm and dry.  Psychiatric: She has a normal mood and affect. Her behavior is normal.  Vitals reviewed.   RECENT LABS AND TESTS: BMET    Component Value Date/Time   NA 141 12/19/2017 0952   K 4.5 12/19/2017 0952   CL 104 12/19/2017 0952   CO2 20 12/19/2017 0952   GLUCOSE 89 12/19/2017 0952   BUN 11 12/19/2017 0952   CREATININE 0.72 12/19/2017 0952   CALCIUM 9.6 12/19/2017 0952   GFRNONAA 99 12/19/2017 0952   GFRAA 114 12/19/2017 0952   Lab Results  Component Value Date   HGBA1C 5.9 (H) 12/19/2017   Lab Results  Component Value Date   INSULIN 31.1 (H) 12/19/2017   CBC    Component Value Date/Time   WBC 6.5 12/19/2017 0952   RBC 4.54 12/19/2017 0952   HGB 12.5 12/19/2017 0952   HCT 38.2 12/19/2017 0952   MCV 84 12/19/2017 0952   MCH 27.5 12/19/2017 0952   MCHC 32.7 12/19/2017 0952   RDW 14.3 12/19/2017 0952   LYMPHSABS 1.9 12/19/2017 0952   EOSABS 0.1 12/19/2017 0952   BASOSABS 0.0 12/19/2017 0952   Iron/TIBC/Ferritin/ %Sat No results found for: IRON, TIBC, FERRITIN,  IRONPCTSAT Lipid Panel     Component Value Date/Time   CHOL 194 12/19/2017 0952   TRIG 244 (H) 12/19/2017 0952   HDL 39 (L) 12/19/2017 0952   LDLCALC 106 (H) 12/19/2017 0952   Hepatic Function Panel     Component Value Date/Time   PROT 6.9 12/19/2017 0952   ALBUMIN 4.3 12/19/2017 0952   AST 18 12/19/2017 0952   ALT 17 12/19/2017 0952   ALKPHOS 81 12/19/2017 0952   BILITOT 0.3 12/19/2017 0952      Component Value  Date/Time   TSH 3.610 12/19/2017 0952  Results for YASMIN, BRONAUGH (MRN [id number]) as of 01/04/2018 08:15  Ref. Range 12/19/2017 09:52  Vitamin D, 25-Hydroxy Latest Ref Range: 30.0 - 100.0 ng/mL 34.5    ASSESSMENT AND PLAN: Vitamin D deficiency - Plan: Vitamin D, Ergocalciferol, (DRISDOL) 50000 units CAPS capsule  Other hyperlipidemia  Prediabetes  At risk for diabetes mellitus  Class 3 severe obesity with serious comorbidity and body mass index (BMI) of 45.0 to 49.9 in adult, unspecified obesity type (HCC)  PLAN:  Hyperlipidemia Michelle Webster was informed of the American Heart Association Guidelines emphasizing intensive lifestyle modifications as the first line treatment for hyperlipidemia. We discussed many lifestyle modifications today in depth, and Makylie will continue to work on decreasing saturated fats such as fatty red meat, butter and many fried foods. She will also increase vegetables and lean protein in her diet and continue to work on diet, exercise, and weight loss efforts. We will recheck labs in 3 months and Michelle Webster agrees to follow up with our clinic in 2 to 3 weeks.  Vitamin D Deficiency Michelle Webster was informed that low vitamin D levels contributes to fatigue and are associated with obesity, breast, and colon cancer. Michelle Webster agrees to restart prescription Vit D ,000 IU every week #4 with no refills, hold OTC Vit D. She will follow up for routine testing of vitamin D, at least 2-3 times per year. She was informed of the risk of over-replacement of vitamin D and  agrees to not increase her dose unless she discusses this with [place] first. Michelle Webster agrees to follow up with our clinic in 2 to 3 weeks.  Pre-Diabetes Michelle Webster will continue to work on weight loss, diet, exercise, and decreasing simple carbohydrates in her diet to help decrease the risk of diabetes. We dicussed metformin including benefits and risks. She was informed that eating too many simple carbohydrates or too many calories at one sitting increases the likelihood of GI side effects. Michelle Webster agrees to continue taking metformin and she agrees to follow up with our clinic in 2 to 3 weeks as directed to monitor her progress.  Diabetes risk counselling Michelle Webster was given extended (30 minutes) diabetes prevention counseling today. She is 52 y.o. female and has risk factors for diabetes including obesity and pre-diabetes. We discussed intensive lifestyle modifications today with an emphasis on weight loss as well as increasing exercise and decreasing simple carbohydrates in her diet.  Obesity Michelle Webster is currently in the action stage of change. As such, her goal is to continue with weight loss efforts She has agreed to follow the Category 3 plan Michelle Webster has been instructed to work up to a goal of 150 minutes of combined cardio and strengthening exercise per week for weight loss and overall health benefits. We discussed the following Behavioral Modification Strategies today: increasing lean protein intake, decreasing simple carbohydrates, work on meal planning and easy cooking plans, and no skipping meals   Michelle Webster has agreed to follow up with our clinic in 2 to 3 weeks. She was informed of the importance of frequent follow up visits to maximize her success with intensive lifestyle modifications for her multiple health conditions.   OBESITY BEHAVIORAL INTERVENTION VISIT  Today's visit was # 2 out of 22.  Starting weight: 280 lbs Starting date: 12/19/17 Today's weight : 271 lbs Today's date: 01/03/2018 Total lbs  lost to date: 9 (Patients must lose 7 lbs in the first 6 months to continue with counseling)   ASK: We discussed the  diagnosis of obesity with Michelle Webster today and Michelle Webster agreed to give [place] permission to discuss obesity behavioral modification therapy today.  ASSESS: Michelle Webster has the diagnosis of obesity and her BMI today is 45.1 Michelle Webster is in the action stage of change   ADVISE: Michelle Webster was educated on the multiple health risks of obesity as well as the benefit of weight loss to improve her health. She was advised of the need for long term treatment and the importance of lifestyle modifications.  AGREE: Multiple dietary modification options and treatment options were discussed and  Daniela agreed to the above obesity treatment plan.  I, Burt Knack, am acting as transcriptionist for Quillian Quince, MD  I have reviewed the above documentation for accuracy and completeness, and I agree with the above. -Quillian Quince, MD

## 2018-01-23 ENCOUNTER — Ambulatory Visit (INDEPENDENT_AMBULATORY_CARE_PROVIDER_SITE_OTHER): Payer: 59 | Admitting: Family Medicine

## 2018-01-23 VITALS — BP 107/69 | HR 65 | Temp 98.1°F | Ht 65.0 in | Wt 267.0 lb

## 2018-01-23 DIAGNOSIS — Z6841 Body Mass Index (BMI) 40.0 and over, adult: Secondary | ICD-10-CM

## 2018-01-23 DIAGNOSIS — E559 Vitamin D deficiency, unspecified: Secondary | ICD-10-CM | POA: Diagnosis not present

## 2018-01-23 DIAGNOSIS — Z9189 Other specified personal risk factors, not elsewhere classified: Secondary | ICD-10-CM | POA: Diagnosis not present

## 2018-01-23 MED ORDER — VITAMIN D (ERGOCALCIFEROL) 1.25 MG (50000 UNIT) PO CAPS: 50000.0000 [IU] | ORAL_CAPSULE | ORAL | 0 refills | Status: DC

## 2018-01-23 NOTE — Progress Notes (Signed)
Office: [phone]  /  Fax: [phone]   HPI:   Chief Complaint: OBESITY Michelle Webster is here to discuss her progress with her obesity treatment plan. She is on the Category 3 plan and is following her eating plan approximately 95 % of the time. She states she is exercising 0 minutes 0 times per week. Nefertari continues to do well with weight loss but is getting bored with dinner options. Hunger is controlled.  Her weight is 267 lb (121.1 kg) today and has had a weight loss of 4 pounds over a period of 3 weeks since her last visit. She has lost 13 lbs since starting treatment with [place].  Vitamin D Deficiency Michelle Webster has a diagnosis of vitamin D deficiency. She is stable on prescription Vit D, not yet at goal. She notes fatigue is improving and denies nausea, vomiting or muscle weakness.  At risk for osteopenia and osteoporosis Michelle Webster is at higher risk of osteopenia and osteoporosis due to vitamin D deficiency.   ALLERGIES: Allergies  Allergen Reactions  . Vicodin [Hydrocodone-Acetaminophen] Itching    MEDICATIONS: Current Outpatient Medications on File Prior to Visit  Medication Sig Dispense Refill  . ASTRAGALUS PO Take 1 tablet by mouth daily.    [name] b complex vitamins tablet Take 1 tablet by mouth daily.    . cetirizine (ZYRTEC) 10 MG tablet Take 10 mg by mouth daily as needed.     . citalopram (CELEXA) 40 MG tablet Take 40 mg by mouth daily.     [name] docusate sodium (COLACE) 100 MG capsule Take 100 mg by mouth 2 (two) times daily.    . fluticasone (FLONASE) 50 MCG/ACT nasal spray Place 2 sprays into both nostrils daily as needed.     . linaclotide (LINZESS) 290 MCG CAPS capsule Take 290 mcg by mouth daily before breakfast.    . metFORMIN (GLUCOPHAGE-XR) 500 MG 24 hr tablet Take 500 mg by mouth 2 (two) times daily.  11  . omeprazole (PRILOSEC) 20 MG capsule Take 20 mg by mouth 2 (two) times daily before a meal.     . Turmeric Curcumin 500 MG CAPS Take 1 capsule by mouth daily.     No  current facility-administered medications on file prior to visit.     PAST MEDICAL HISTORY: Past Medical History:  Diagnosis Date  . Anxiety   . Back pain   . Constipation   . Dyspnea   . Eczema   . Fatty liver   . GERD (gastroesophageal reflux disease)   . HLD (hyperlipidemia)   . IBS (irritable bowel syndrome)   . Joint pain   . Morbid obesity (HCC)   . Neurotic depression   . OSA (obstructive sleep apnea)   . Palpitations   . Prediabetes   . Vitamin D deficiency     PAST SURGICAL HISTORY: Past Surgical History:  Procedure Laterality Date  . BREAST EXCISIONAL BIOPSY Right   . GALLBLADDER SURGERY    . LUM PECTOMY      SOCIAL HISTORY: Social History   Tobacco Use  . Smoking status: Never Smoker  . Smokeless tobacco: Never Used  Substance Use Topics  . Alcohol use: No  . Drug use: Not on file    FAMILY HISTORY: Family History  Problem Relation Age of Onset  . Depression Mother        ECT  . Hypertension Mother   . Hyperlipidemia Mother   . Anxiety disorder Mother   . Obesity Mother   . Hypertension  Father   . Other Father        cardiac ablations x 2  . Hyperlipidemia Father   . Depression Father   . Anxiety disorder Father   . Sleep apnea Father   . Obesity Father     ROS: Review of Systems  Constitutional: Positive for malaise/fatigue and weight loss.  Gastrointestinal: Negative for nausea and vomiting.  Musculoskeletal:       Negative muscle weakness    PHYSICAL EXAM: Blood pressure 107/69, pulse 65, temperature 98.1 F (36.7 C), temperature source Oral, height 5\' 5"  (1.651 m), weight 267 lb (121.1 kg), SpO2 98 %. Body mass index is 44.43 kg/m. Physical Exam  Constitutional: She is oriented to person, place, and time. She appears well-developed and well-nourished.  Cardiovascular: Normal rate.  Pulmonary/Chest: Effort normal.  Musculoskeletal: Normal range of motion.  Neurological: She is oriented to person, place, and time.  Skin:  Skin is warm and dry.  Psychiatric: She has a normal mood and affect. Her behavior is normal.  Vitals reviewed.   RECENT LABS AND TESTS: BMET    Component Value Date/Time   NA 141 12/19/2017 0952   K 4.5 12/19/2017 0952   CL 104 12/19/2017 0952   CO2 20 12/19/2017 0952   GLUCOSE 89 12/19/2017 0952   BUN 11 12/19/2017 0952   CREATININE 0.72 12/19/2017 0952   CALCIUM 9.6 12/19/2017 0952   GFRNONAA 99 12/19/2017 0952   GFRAA 114 12/19/2017 0952   Lab Results  Component Value Date   HGBA1C 5.9 (H) 12/19/2017   Lab Results  Component Value Date   INSULIN 31.1 (H) 12/19/2017   CBC    Component Value Date/Time   WBC 6.5 12/19/2017 0952   RBC 4.54 12/19/2017 0952   HGB 12.5 12/19/2017 0952   HCT 38.2 12/19/2017 0952   MCV 84 12/19/2017 0952   MCH 27.5 12/19/2017 0952   MCHC 32.7 12/19/2017 0952   RDW 14.3 12/19/2017 0952   LYMPHSABS 1.9 12/19/2017 0952   EOSABS 0.1 12/19/2017 0952   BASOSABS 0.0 12/19/2017 0952   Iron/TIBC/Ferritin/ %Sat No results found for: IRON, TIBC, FERRITIN, IRONPCTSAT Lipid Panel     Component Value Date/Time   CHOL 194 12/19/2017 0952   TRIG 244 (H) 12/19/2017 0952   HDL 39 (L) 12/19/2017 0952   LDLCALC 106 (H) 12/19/2017 0952   Hepatic Function Panel     Component Value Date/Time   PROT 6.9 12/19/2017 0952   ALBUMIN 4.3 12/19/2017 0952   AST 18 12/19/2017 0952   ALT 17 12/19/2017 0952   ALKPHOS 81 12/19/2017 0952   BILITOT 0.3 12/19/2017 0952      Component Value Date/Time   TSH 3.610 12/19/2017 0952  Results for Amalia HaileyMARINE, [place] (MRN [id number]) as of 01/23/2018 11:37  Ref. Range 12/19/2017 09:52  Vitamin D, 25-Hydroxy Latest Ref Range: 30.0 - 100.0 ng/mL 34.5    ASSESSMENT AND PLAN: Vitamin D deficiency - Plan: Vitamin D, Ergocalciferol, (DRISDOL) 50000 units CAPS capsule  At risk for osteoporosis  Class 3 severe obesity with serious comorbidity and body mass index (BMI) of 40.0 to 44.9 in adult, unspecified obesity type  (HCC)  PLAN:  Vitamin D Deficiency Raynelle FanningJulie was informed that low vitamin D levels contributes to fatigue and are associated with obesity, breast, and colon cancer. Raynelle FanningJulie agrees to continue taking prescription Vit D @50 ,000 IU every week #4 and we will refill for 1 month. She will follow up for routine testing of vitamin D, at least 2-3  times per year. She was informed of the risk of over-replacement of vitamin D and agrees to not increase her dose unless she discusses this with [place] first. Michelle Webster agrees to follow up with our clinic in 2 to 3 weeks.  At risk for osteopenia and osteoporosis Michelle Webster is at risk for osteopenia and osteoporsis due to her vitamin D deficiency. She was encouraged to take her vitamin D and follow her higher calcium diet and increase strengthening exercise to help strengthen her bones and decrease her risk of osteopenia and osteoporosis.  Obesity Michelle Webster is currently in the action stage of change. As such, her goal is to continue with weight loss efforts She has agreed to change to keep a food journal with 400-550 calories and 40 grams of protein at supper daily and follow the Category 3 plan Lior has been instructed to work up to a goal of 150 minutes of combined cardio and strengthening exercise per week for weight loss and overall health benefits. We discussed the following Behavioral Modification Strategies today: increasing lean protein intake and decreasing simple carbohydrates    Michelle Webster has agreed to follow up with our clinic in 2 to 3 weeks. She was informed of the importance of frequent follow up visits to maximize her success with intensive lifestyle modifications for her multiple health conditions.   OBESITY BEHAVIORAL INTERVENTION VISIT  Today's visit was # 3 out of 22.  Starting weight: 280 lbs Starting date: 12/19/17 Today's weight : 267 lbs Today's date: 01/23/2018 Total lbs lost to date: 13 (Patients must lose 7 lbs in the first 6 months to continue with  counseling)   ASK: We discussed the diagnosis of obesity with Amalia Hailey today and Kamrie agreed to give [place] permission to discuss obesity behavioral modification therapy today.  ASSESS: Michelle Webster has the diagnosis of obesity and her BMI today is 44.43 Michelle Webster is in the action stage of change   ADVISE: Michelle Webster was educated on the multiple health risks of obesity as well as the benefit of weight loss to improve her health. She was advised of the need for long term treatment and the importance of lifestyle modifications.  AGREE: Multiple dietary modification options and treatment options were discussed and  Michelle Webster agreed to the above obesity treatment plan.  I, Burt Knack, am acting as transcriptionist for Quillian Quince, MD  I have reviewed the above documentation for accuracy and completeness, and I agree with the above. -Quillian Quince, MD

## 2018-01-24 ENCOUNTER — Ambulatory Visit (INDEPENDENT_AMBULATORY_CARE_PROVIDER_SITE_OTHER): Payer: 59 | Admitting: Family Medicine

## 2018-02-05 ENCOUNTER — Other Ambulatory Visit: Payer: Self-pay | Admitting: Obstetrics and Gynecology

## 2018-02-05 DIAGNOSIS — Z1231 Encounter for screening mammogram for malignant neoplasm of breast: Secondary | ICD-10-CM

## 2018-02-15 ENCOUNTER — Ambulatory Visit (INDEPENDENT_AMBULATORY_CARE_PROVIDER_SITE_OTHER): Payer: 59 | Admitting: Family Medicine

## 2018-02-15 VITALS — BP 103/67 | HR 57 | Temp 97.6°F | Ht 65.0 in | Wt 257.0 lb

## 2018-02-15 DIAGNOSIS — F4321 Adjustment disorder with depressed mood: Secondary | ICD-10-CM

## 2018-02-15 DIAGNOSIS — E559 Vitamin D deficiency, unspecified: Secondary | ICD-10-CM | POA: Diagnosis not present

## 2018-02-15 DIAGNOSIS — Z6841 Body Mass Index (BMI) 40.0 and over, adult: Secondary | ICD-10-CM

## 2018-02-15 DIAGNOSIS — E66813 Obesity, class 3: Secondary | ICD-10-CM

## 2018-02-15 MED ORDER — VITAMIN D (ERGOCALCIFEROL) 1.25 MG (50000 UNIT) PO CAPS
50000.0000 [IU] | ORAL_CAPSULE | ORAL | 0 refills | Status: DC
Start: 2018-02-15 — End: 2018-03-28

## 2018-02-19 NOTE — Progress Notes (Signed)
Office: [phone]  /  Fax: [phone]   HPI:   Chief Complaint: OBESITY Michelle Webster is here to discuss her progress with her obesity treatment plan. She is on the keep a food journal with 400 to 550 calories and 40 grams of protein at supper daily and the Category 3 plan and is following her eating plan approximately 85 % of the time. She states she is exercising 0 minutes 0 times per week. Michelle Webster has done very well with weight loss in the last few weeks, even with dealing with the stress of her close friends death. Michelle Webster is surprised she lost as much as she did, but denied skipping meals. Her weight is 257 lb (116.6 kg) today and has had a weight loss of 10 pounds over a period of 3 weeks since her last visit. She has lost 23 lbs since starting treatment with [place].  Vitamin D deficiency Michelle Webster has a diagnosis of vitamin D deficiency. Michelle Webster is stable on vit D, but she is not yet at goal. She denies nausea, vomiting or muscle weakness.  Grieving Reaction Michelle Webster is going through normal grieving and is understandably sad, but she denies any suicidal or homicidal ideas. She is still able to go about her daily routine.  ALLERGIES: Allergies  Allergen Reactions  . Vicodin [Hydrocodone-Acetaminophen] Itching    MEDICATIONS: Current Outpatient Medications on File Prior to Visit  Medication Sig Dispense Refill  . ASTRAGALUS PO Take 1 tablet by mouth daily.    [name] b complex vitamins tablet Take 1 tablet by mouth daily.    . cetirizine (ZYRTEC) 10 MG tablet Take 10 mg by mouth daily as needed.     . citalopram (CELEXA) 40 MG tablet Take 40 mg by mouth daily.     [name] docusate sodium (COLACE) 100 MG capsule Take 100 mg by mouth 2 (two) times daily.    . fluticasone (FLONASE) 50 MCG/ACT nasal spray Place 2 sprays into both nostrils daily as needed.     . linaclotide (LINZESS) 290 MCG CAPS capsule Take 290 mcg by mouth daily before breakfast.    . metFORMIN (GLUCOPHAGE-XR) 500 MG 24 hr tablet Take 500 mg  by mouth 2 (two) times daily.  11  . omeprazole (PRILOSEC) 20 MG capsule Take 20 mg by mouth 2 (two) times daily before a meal.     . Turmeric Curcumin 500 MG CAPS Take 1 capsule by mouth daily.     No current facility-administered medications on file prior to visit.     PAST MEDICAL HISTORY: Past Medical History:  Diagnosis Date  . Anxiety   . Back pain   . Constipation   . Dyspnea   . Eczema   . Fatty liver   . GERD (gastroesophageal reflux disease)   . HLD (hyperlipidemia)   . IBS (irritable bowel syndrome)   . Joint pain   . Morbid obesity (HCC)   . Neurotic depression   . OSA (obstructive sleep apnea)   . Palpitations   . Prediabetes   . Vitamin D deficiency     PAST SURGICAL HISTORY: Past Surgical History:  Procedure Laterality Date  . BREAST EXCISIONAL BIOPSY Right   . GALLBLADDER SURGERY    . LUM PECTOMY      SOCIAL HISTORY: Social History   Tobacco Use  . Smoking status: Never Smoker  . Smokeless tobacco: Never Used  Substance Use Topics  . Alcohol use: No  . Drug use: Not on file    FAMILY HISTORY:  Family History  Problem Relation Age of Onset  . Depression Mother        ECT  . Hypertension Mother   . Hyperlipidemia Mother   . Anxiety disorder Mother   . Obesity Mother   . Hypertension Father   . Other Father        cardiac ablations x 2  . Hyperlipidemia Father   . Depression Father   . Anxiety disorder Father   . Sleep apnea Father   . Obesity Father     ROS: Review of Systems  Constitutional: Positive for weight loss.  Gastrointestinal: Negative for nausea and vomiting.  Musculoskeletal:       Negative for muscle weakness  Psychiatric/Behavioral: Negative for suicidal ideas.       Positive for grieving reaction    PHYSICAL EXAM: Blood pressure 103/67, pulse (!) 57, temperature 97.6 F (36.4 C), temperature source Oral, height 5\' 5"  (1.651 m), weight 257 lb (116.6 kg), SpO2 96 %. Body mass index is 42.77 kg/m. Physical Exam    Constitutional: She is oriented to person, place, and time. She appears well-developed and well-nourished.  Cardiovascular: Normal rate.  Pulmonary/Chest: Effort normal.  Musculoskeletal: Normal range of motion.  Neurological: She is oriented to person, place, and time.  Skin: Skin is warm and dry.  Psychiatric: She expresses no homicidal and no suicidal ideation.  Vitals reviewed.   RECENT LABS AND TESTS: BMET    Component Value Date/Time   NA 141 12/19/2017 0952   K 4.5 12/19/2017 0952   CL 104 12/19/2017 0952   CO2 20 12/19/2017 0952   GLUCOSE 89 12/19/2017 0952   BUN 11 12/19/2017 0952   CREATININE 0.72 12/19/2017 0952   CALCIUM 9.6 12/19/2017 0952   GFRNONAA 99 12/19/2017 0952   GFRAA 114 12/19/2017 0952   Lab Results  Component Value Date   HGBA1C 5.9 (H) 12/19/2017   Lab Results  Component Value Date   INSULIN 31.1 (H) 12/19/2017   CBC    Component Value Date/Time   WBC 6.5 12/19/2017 0952   RBC 4.54 12/19/2017 0952   HGB 12.5 12/19/2017 0952   HCT 38.2 12/19/2017 0952   MCV 84 12/19/2017 0952   MCH 27.5 12/19/2017 0952   MCHC 32.7 12/19/2017 0952   RDW 14.3 12/19/2017 0952   LYMPHSABS 1.9 12/19/2017 0952   EOSABS 0.1 12/19/2017 0952   BASOSABS 0.0 12/19/2017 0952   Iron/TIBC/Ferritin/ %Sat No results found for: IRON, TIBC, FERRITIN, IRONPCTSAT Lipid Panel     Component Value Date/Time   CHOL 194 12/19/2017 0952   TRIG 244 (H) 12/19/2017 0952   HDL 39 (L) 12/19/2017 0952   LDLCALC 106 (H) 12/19/2017 0952   Hepatic Function Panel     Component Value Date/Time   PROT 6.9 12/19/2017 0952   ALBUMIN 4.3 12/19/2017 0952   AST 18 12/19/2017 0952   ALT 17 12/19/2017 0952   ALKPHOS 81 12/19/2017 0952   BILITOT 0.3 12/19/2017 0952      Component Value Date/Time   TSH 3.610 12/19/2017 0952   Results for Michelle HaileyMARINE, Michelle Webster (MRN [id number]) as of 02/19/2018 07:52  Ref. Range 12/19/2017 09:52  Vitamin D, 25-Hydroxy Latest Ref Range: 30.0 - 100.0 ng/mL 34.5    ASSESSMENT AND PLAN: Vitamin D deficiency - Plan: Vitamin D, Ergocalciferol, (DRISDOL) 50000 units CAPS capsule  Grieving - normal  Class 3 severe obesity with serious comorbidity and body mass index (BMI) of 40.0 to 44.9 in adult, unspecified obesity type (HCC)  PLAN:  Vitamin D Deficiency Michelle Webster was informed that low vitamin D levels contributes to fatigue and are associated with obesity, breast, and colon cancer. She agrees to continue to take prescription Vit D @50 ,000 IU every week #4 with no refills and will follow up for routine testing of vitamin D, at least 2-3 times per year. She was informed of the risk of over-replacement of vitamin D and agrees to not increase her dose unless she discusses this with [place] first. Michelle Webster agrees to follow up as directed.  Grieving Reaction Michelle Webster was counseled on healthy and normal grieving and was offered comfort and support.  We spent > than 50% of the 15 minute visit on the counseling as documented in the note.  Obesity Michelle Webster is currently in the action stage of change. As such, her goal is to continue with weight loss efforts She has agreed to follow the Category 3 plan Michelle Webster has been instructed to work up to a goal of 150 minutes of combined cardio and strengthening exercise per week for weight loss and overall health benefits. We discussed the following Behavioral Modification Strategies today: increasing vegetables, work on meal planning and easy cooking plans and emotional eating strategies  Michelle Webster has agreed to follow up with our clinic in 3 weeks. She was informed of the importance of frequent follow up visits to maximize her success with intensive lifestyle modifications for her multiple health conditions.   OBESITY BEHAVIORAL INTERVENTION VISIT  Today's visit was # 4 out of 22.  Starting weight: 280 lbs Starting date: 12/19/17 Today's weight : 257 lbs  Today's date: 02/15/2018 Total lbs lost to date: 23 (Patients must lose 7 lbs in  the first 6 months to continue with counseling)   ASK: We discussed the diagnosis of obesity with Michelle Webster today and Michelle Webster agreed to give [place] permission to discuss obesity behavioral modification therapy today.  ASSESS: Michelle Webster has the diagnosis of obesity and her BMI today is 42.77 Michelle Webster is in the action stage of change   ADVISE: Michelle Webster was educated on the multiple health risks of obesity as well as the benefit of weight loss to improve her health. She was advised of the need for long term treatment and the importance of lifestyle modifications.  AGREE: Multiple dietary modification options and treatment options were discussed and  Michelle Webster agreed to the above obesity treatment plan.  I, Nevada Crane, am acting as transcriptionist for Quillian Quince, MD  I have reviewed the above documentation for accuracy and completeness, and I agree with the above. -Quillian Quince, MD

## 2018-03-06 ENCOUNTER — Ambulatory Visit (INDEPENDENT_AMBULATORY_CARE_PROVIDER_SITE_OTHER): Payer: 59 | Admitting: Family Medicine

## 2018-03-06 VITALS — BP 101/67 | HR 69 | Temp 98.1°F | Ht 65.0 in | Wt 256.0 lb

## 2018-03-06 DIAGNOSIS — K5900 Constipation, unspecified: Secondary | ICD-10-CM | POA: Diagnosis not present

## 2018-03-06 DIAGNOSIS — Z6841 Body Mass Index (BMI) 40.0 and over, adult: Secondary | ICD-10-CM | POA: Diagnosis not present

## 2018-03-07 NOTE — Progress Notes (Signed)
Office: [phone]  /  Fax: [phone]   HPI:   Chief Complaint: OBESITY Michelle Webster is here to discuss her progress with her obesity treatment plan. She is on the Category 3 plan and is following her eating plan approximately 75 % of the time. She states she is exercising 0 minutes 0 times per week. Michelle Webster has increased traveling, but did well with making better choices and still was able to lose weight. She states she is ready to start exercising and would like to start Zumba. Her weight is 256 lb (116.1 kg) today and has had a weight loss of 1 pound over a period of 2 weeks since her last visit. She has lost 24 lbs since starting treatment with [place].  Constipation Michelle Webster notes constipation for the last few weeks, worse since attempting weight loss. The constipation is mostly controlled on Linzess , but she notes increased constipation with recent traveling.   ALLERGIES: Allergies  Allergen Reactions  . Vicodin [Hydrocodone-Acetaminophen] Itching    MEDICATIONS: Current Outpatient Medications on File Prior to Visit  Medication Sig Dispense Refill  . ASTRAGALUS PO Take 1 tablet by mouth daily.    [name] b complex vitamins tablet Take 1 tablet by mouth daily.    . cetirizine (ZYRTEC) 10 MG tablet Take 10 mg by mouth daily as needed.     . citalopram (CELEXA) 40 MG tablet Take 40 mg by mouth daily.     [name] docusate sodium (COLACE) 100 MG capsule Take 100 mg by mouth 2 (two) times daily.    . fluticasone (FLONASE) 50 MCG/ACT nasal spray Place 2 sprays into both nostrils daily as needed.     . linaclotide (LINZESS) 290 MCG CAPS capsule Take 290 mcg by mouth daily before breakfast.    . metFORMIN (GLUCOPHAGE-XR) 500 MG 24 hr tablet Take 500 mg by mouth 2 (two) times daily.  11  . omeprazole (PRILOSEC) 20 MG capsule Take 20 mg by mouth 2 (two) times daily before a meal.     . Turmeric Curcumin 500 MG CAPS Take 1 capsule by mouth daily.    . Vitamin D, Ergocalciferol, (DRISDOL) 50000 units  CAPS capsule Take 1 capsule (50,000 Units total) by mouth every 7 (seven) days. 4 capsule 0   No current facility-administered medications on file prior to visit.     PAST MEDICAL HISTORY: Past Medical History:  Diagnosis Date  . Anxiety   . Back pain   . Constipation   . Dyspnea   . Eczema   . Fatty liver   . GERD (gastroesophageal reflux disease)   . HLD (hyperlipidemia)   . IBS (irritable bowel syndrome)   . Joint pain   . Morbid obesity (HCC)   . Neurotic depression   . OSA (obstructive sleep apnea)   . Palpitations   . Prediabetes   . Vitamin D deficiency     PAST SURGICAL HISTORY: Past Surgical History:  Procedure Laterality Date  . BREAST EXCISIONAL BIOPSY Right   . GALLBLADDER SURGERY    . LUM PECTOMY      SOCIAL HISTORY: Social History   Tobacco Use  . Smoking status: Never Smoker  . Smokeless tobacco: Never Used  Substance Use Topics  . Alcohol use: No  . Drug use: Not on file    FAMILY HISTORY: Family History  Problem Relation Age of Onset  . Depression Mother        ECT  . Hypertension Mother   . Hyperlipidemia Mother   .  Anxiety disorder Mother   . Obesity Mother   . Hypertension Father   . Other Father        cardiac ablations x 2  . Hyperlipidemia Father   . Depression Father   . Anxiety disorder Father   . Sleep apnea Father   . Obesity Father     ROS: Review of Systems  Constitutional: Positive for weight loss.  Gastrointestinal: Positive for constipation.    PHYSICAL EXAM: Blood pressure 101/67, pulse 69, temperature 98.1 F (36.7 C), temperature source Oral, height 5\' 5"  (1.651 m), weight 256 lb (116.1 kg), SpO2 98 %. Body mass index is 42.6 kg/m. Physical Exam  Constitutional: She is oriented to person, place, and time. She appears well-developed and well-nourished.  Cardiovascular: Normal rate.  Pulmonary/Chest: Effort normal.  Musculoskeletal: Normal range of motion.  Neurological: She is oriented to person, place,  and time.  Skin: Skin is warm and dry.  Psychiatric: She has a normal mood and affect. Her behavior is normal.    RECENT LABS AND TESTS: BMET    Component Value Date/Time   NA 141 12/19/2017 0952   K 4.5 12/19/2017 0952   CL 104 12/19/2017 0952   CO2 20 12/19/2017 0952   GLUCOSE 89 12/19/2017 0952   BUN 11 12/19/2017 0952   CREATININE 0.72 12/19/2017 0952   CALCIUM 9.6 12/19/2017 0952   GFRNONAA 99 12/19/2017 0952   GFRAA 114 12/19/2017 0952   Lab Results  Component Value Date   HGBA1C 5.9 (H) 12/19/2017   Lab Results  Component Value Date   INSULIN 31.1 (H) 12/19/2017   CBC    Component Value Date/Time   WBC 6.5 12/19/2017 0952   RBC 4.54 12/19/2017 0952   HGB 12.5 12/19/2017 0952   HCT 38.2 12/19/2017 0952   MCV 84 12/19/2017 0952   MCH 27.5 12/19/2017 0952   MCHC 32.7 12/19/2017 0952   RDW 14.3 12/19/2017 0952   LYMPHSABS 1.9 12/19/2017 0952   EOSABS 0.1 12/19/2017 0952   BASOSABS 0.0 12/19/2017 0952   Iron/TIBC/Ferritin/ %Sat No results found for: IRON, TIBC, FERRITIN, IRONPCTSAT Lipid Panel     Component Value Date/Time   CHOL 194 12/19/2017 0952   TRIG 244 (H) 12/19/2017 0952   HDL 39 (L) 12/19/2017 0952   LDLCALC 106 (H) 12/19/2017 0952   Hepatic Function Panel     Component Value Date/Time   PROT 6.9 12/19/2017 0952   ALBUMIN 4.3 12/19/2017 0952   AST 18 12/19/2017 0952   ALT 17 12/19/2017 0952   ALKPHOS 81 12/19/2017 0952   BILITOT 0.3 12/19/2017 0952      Component Value Date/Time   TSH 3.610 12/19/2017 0952    Results for Michelle HaileyMARINE, Michelle Webster (MRN [id number]) as of 03/07/2018 11:52  Ref. Range 12/19/2017 09:52  Vitamin D, 25-Hydroxy Latest Ref Range: 30.0 - 100.0 ng/mL 34.5    ASSESSMENT AND PLAN: Constipation, unspecified constipation type  Class 3 severe obesity with serious comorbidity and body mass index (BMI) of 40.0 to 44.9 in adult, unspecified obesity type (HCC)  PLAN:  Constipation Michelle Webster was informed decreased bowel movement  frequency is normal while losing weight, but stools should not be hard or painful. She was advised to increase her H20 intake and work on increasing her fiber intake. High fiber foods were discussed today. Michelle Webster agrees to continue to increase H20 and fiber. She will add exercise to help with constipation.  We spent > than 50% of the 15 minute visit on the counseling  as documented in the note.  Obesity Michelle Webster is currently in the action stage of change. As such, her goal is to continue with weight loss efforts. She has agreed to follow the Category 3 plan. Michelle Webster has been instructed to work up to a goal of 150 minutes of combined cardio and strengthening exercise per week or start Zumba 2 times per week for weight loss and overall health benefits. We discussed the following Behavioral Modification Stratagies today: increasing lean protein intake, decreasing simple carbohydrates, increasing H20 intake, increase high fiber foods, work on meal planning and easy cooking plans, keeping healthy foods in the home, and better snacking choices.  Michelle Webster has agreed to follow up with our clinic in 3 weeks. She was informed of the importance of frequent follow up visits to maximize her success with intensive lifestyle modifications for her multiple health conditions.   OBESITY BEHAVIORAL INTERVENTION VISIT  Today's visit was # 5 out of 22.  Starting weight: 280 lbs Starting date: 12/19/17 Today's weight : Weight: 256 lb (116.1 kg)  Today's date: 03/07/2018 Total lbs lost to date: 24    ASK: We discussed the diagnosis of obesity with Michelle Webster today and Markayla agreed to give [place] permission to discuss obesity behavioral modification therapy today.  ASSESS: Amillion has the diagnosis of obesity and her BMI today is 42.6   Damia is in the action stage of change.   ADVISE: Ellsie was educated on the multiple health risks of obesity as well as the benefit of weight loss to improve her health. She was advised of  the need for long term treatment and the importance of lifestyle modifications.  AGREE: Multiple dietary modification options and treatment options were discussed and  Isatu agreed to the above obesity treatment plan.  I, Kirke Corin, am acting as Energy manager for Quillian Quince, MD  I have reviewed the above documentation for accuracy and completeness, and I agree with the above. -Quillian Quince, MD

## 2018-03-08 ENCOUNTER — Ambulatory Visit
Admission: RE | Admit: 2018-03-08 | Discharge: 2018-03-08 | Disposition: A | Discharge status: Home / Self Care | Payer: 59 | Source: Ambulatory Visit | Attending: Obstetrics and Gynecology | Admitting: Obstetrics and Gynecology

## 2018-03-08 DIAGNOSIS — Z1231 Encounter for screening mammogram for malignant neoplasm of breast: Secondary | ICD-10-CM

## 2018-03-12 ENCOUNTER — Encounter: Payer: Self-pay | Admitting: Adult Health

## 2018-03-22 ENCOUNTER — Other Ambulatory Visit (INDEPENDENT_AMBULATORY_CARE_PROVIDER_SITE_OTHER): Payer: Self-pay | Admitting: Family Medicine

## 2018-03-22 DIAGNOSIS — E559 Vitamin D deficiency, unspecified: Secondary | ICD-10-CM

## 2018-03-28 ENCOUNTER — Ambulatory Visit (INDEPENDENT_AMBULATORY_CARE_PROVIDER_SITE_OTHER): Payer: 59 | Admitting: Family Medicine

## 2018-03-28 VITALS — BP 98/63 | HR 68 | Temp 97.9°F | Ht 65.0 in | Wt 252.0 lb

## 2018-03-28 DIAGNOSIS — Z9189 Other specified personal risk factors, not elsewhere classified: Secondary | ICD-10-CM

## 2018-03-28 DIAGNOSIS — E559 Vitamin D deficiency, unspecified: Secondary | ICD-10-CM

## 2018-03-28 DIAGNOSIS — Z6841 Body Mass Index (BMI) 40.0 and over, adult: Secondary | ICD-10-CM | POA: Diagnosis not present

## 2018-03-28 DIAGNOSIS — E66813 Obesity, class 3: Secondary | ICD-10-CM

## 2018-03-28 MED ORDER — VITAMIN D (ERGOCALCIFEROL) 1.25 MG (50000 UNIT) PO CAPS: 50000.0000 [IU] | ORAL_CAPSULE | ORAL | 0 refills | Status: DC

## 2018-03-28 NOTE — Progress Notes (Signed)
Office: [phone]  /  Fax: [phone]   HPI:   Chief Complaint: OBESITY Michelle Webster is here to discuss her progress with her obesity treatment plan. She is on the Category 3 plan and is following her eating plan approximately 85 % of the time. She states she is exercising 0 minutes 0 times per week. Michelle Webster continues to do well with weight loss on Category 3. She hasn't started exercise yet as she is potentially transitioning to a new job. She notes hunger is controlled.  Her weight is 252 lb (114.3 kg) today and has had a weight loss of 4 pounds over a period of 3 weeks since her last visit. She has lost 28 lbs since starting treatment with us.  Vitamin D Deficiency Michelle Webster has a diagnosis of vitamin D deficiency. She is on prescription Vit D, not yet at goal and she is due for labs soon. She denies nausea, vomiting or muscle weakness.  At risk for osteopenia and osteoporosis Michelle Webster is at higher risk of osteopenia and osteoporosis due to vitamin D deficiency.   ALLERGIES: Allergies  Allergen Reactions  . Vicodin [Hydrocodone-Acetaminophen] Itching    MEDICATIONS: Current Outpatient Medications on File Prior to Visit  Medication Sig Dispense Refill  . ASTRAGALUS PO Take 1 tablet by mouth daily.    [name]. b complex vitamins tablet Take 1 tablet by mouth daily.    . cetirizine (ZYRTEC) 10 MG tablet Take 10 mg by mouth daily as needed.     . citalopram (CELEXA) 40 MG tablet Take 40 mg by mouth daily.     [name]. docusate sodium (COLACE) 100 MG capsule Take 100 mg by mouth 2 (two) times daily.    . fluticasone (FLONASE) 50 MCG/ACT nasal spray Place 2 sprays into both nostrils daily as needed.     . linaclotide (LINZESS) 290 MCG CAPS capsule Take 290 mcg by mouth daily before breakfast.    . metFORMIN (GLUCOPHAGE-XR) 500 MG 24 hr tablet Take 500 mg by mouth 2 (two) times daily.  11  . omeprazole (PRILOSEC) 20 MG capsule Take 20 mg by mouth 2 (two) times daily before a meal.     . Turmeric Curcumin  500 MG CAPS Take 1 capsule by mouth daily.    . Vitamin D, Ergocalciferol, (DRISDOL) 50000 units CAPS capsule Take 1 capsule (50,000 Units total) by mouth every 7 (seven) days. 4 capsule 0   No current facility-administered medications on file prior to visit.     PAST MEDICAL HISTORY: Past Medical History:  Diagnosis Date  . Anxiety   . Back pain   . Constipation   . Dyspnea   . Eczema   . Fatty liver   . GERD (gastroesophageal reflux disease)   . HLD (hyperlipidemia)   . IBS (irritable bowel syndrome)   . Joint pain   . Morbid obesity (HCC)   . Neurotic depression   . OSA (obstructive sleep apnea)   . Palpitations   . Prediabetes   . Vitamin D deficiency     PAST SURGICAL HISTORY: Past Surgical History:  Procedure Laterality Date  . BREAST EXCISIONAL BIOPSY Right   . GALLBLADDER SURGERY    . LUM PECTOMY      SOCIAL HISTORY: Social History   Tobacco Use  . Smoking status: Never Smoker  . Smokeless tobacco: Never Used  Substance Use Topics  . Alcohol use: No  . Drug use: Not on file    FAMILY HISTORY: Family History  Problem Relation Age  of Onset  . Depression Mother        ECT  . Hypertension Mother   . Hyperlipidemia Mother   . Anxiety disorder Mother   . Obesity Mother   . Hypertension Father   . Other Father        cardiac ablations x 2  . Hyperlipidemia Father   . Depression Father   . Anxiety disorder Father   . Sleep apnea Father   . Obesity Father     ROS: Review of Systems  Constitutional: Positive for weight loss.  Gastrointestinal: Negative for nausea and vomiting.  Musculoskeletal:       Negative muscle weakness    PHYSICAL EXAM: Blood pressure 98/63, pulse 68, temperature 97.9 F (36.6 C), temperature source Oral, height 5\' 5"  (1.651 m), weight 252 lb (114.3 kg), last menstrual period 03/15/2018, SpO2 99 %. Body mass index is 41.93 kg/m. Physical Exam  Constitutional: She is oriented to person, place, and time. She appears  well-developed and well-nourished.  Cardiovascular: Normal rate.  Pulmonary/Chest: Effort normal.  Musculoskeletal: Normal range of motion.  Neurological: She is oriented to person, place, and time.  Skin: Skin is warm and dry.  Psychiatric: She has a normal mood and affect. Her behavior is normal.  Vitals reviewed.   RECENT LABS AND TESTS: BMET    Component Value Date/Time   NA 141 12/19/2017 0952   K 4.5 12/19/2017 0952   CL 104 12/19/2017 0952   CO2 20 12/19/2017 0952   GLUCOSE 89 12/19/2017 0952   BUN 11 12/19/2017 0952   CREATININE 0.72 12/19/2017 0952   CALCIUM 9.6 12/19/2017 0952   GFRNONAA 99 12/19/2017 0952   GFRAA 114 12/19/2017 0952   Lab Results  Component Value Date   HGBA1C 5.9 (H) 12/19/2017   Lab Results  Component Value Date   INSULIN 31.1 (H) 12/19/2017   CBC    Component Value Date/Time   WBC 6.5 12/19/2017 0952   RBC 4.54 12/19/2017 0952   HGB 12.5 12/19/2017 0952   HCT 38.2 12/19/2017 0952   MCV 84 12/19/2017 0952   MCH 27.5 12/19/2017 0952   MCHC 32.7 12/19/2017 0952   RDW 14.3 12/19/2017 0952   LYMPHSABS 1.9 12/19/2017 0952   EOSABS 0.1 12/19/2017 0952   BASOSABS 0.0 12/19/2017 0952   Iron/TIBC/Ferritin/ %Sat No results found for: IRON, TIBC, FERRITIN, IRONPCTSAT Lipid Panel     Component Value Date/Time   CHOL 194 12/19/2017 0952   TRIG 244 (H) 12/19/2017 0952   HDL 39 (L) 12/19/2017 0952   LDLCALC 106 (H) 12/19/2017 0952   Hepatic Function Panel     Component Value Date/Time   PROT 6.9 12/19/2017 0952   ALBUMIN 4.3 12/19/2017 0952   AST 18 12/19/2017 0952   ALT 17 12/19/2017 0952   ALKPHOS 81 12/19/2017 0952   BILITOT 0.3 12/19/2017 0952      Component Value Date/Time   TSH 3.610 12/19/2017 0952  Results for Amalia HaileyMARINE, Shekina (MRN [id number]) as of 03/28/2018 08:15  Ref. Range 12/19/2017 09:52  Vitamin D, 25-Hydroxy Latest Ref Range: 30.0 - 100.0 ng/mL 34.5    ASSESSMENT AND PLAN: Vitamin D deficiency - Plan: Vitamin D,  Ergocalciferol, (DRISDOL) 50000 units CAPS capsule  At risk for osteoporosis  Class 3 severe obesity with serious comorbidity and body mass index (BMI) of 40.0 to 44.9 in adult, unspecified obesity type (HCC)  PLAN:  Vitamin D Deficiency Michelle Webster was informed that low vitamin D levels contributes to fatigue and are  associated with obesity, breast, and colon cancer. Anushree agrees to continue taking prescription Vit D @50 ,000 IU every week #4 and we will refill for 1 month. She will follow up for routine testing of vitamin D, at least 2-3 times per year. She was informed of the risk of over-replacement of vitamin D and agrees to not increase her dose unless she discusses this with [place] first. Tonisha agrees to follow up with our clinic in 2 weeks and we will check labs at that time.  At risk for osteopenia and osteoporosis Jaquelyn is at risk for osteopenia and osteoporsis due to her vitamin D deficiency. She was encouraged to take her vitamin D and follow her higher calcium diet and increase strengthening exercise to help strengthen her bones and decrease her risk of osteopenia and osteoporosis.  Obesity Alayssa is currently in the action stage of change. As such, her goal is to continue with weight loss efforts She has agreed to follow the Category 3 plan Lillyn has been instructed to work up to a goal of 150 minutes of combined cardio and strengthening exercise per week or start small amount of exercise daily (15 minutes) for weight loss and overall health benefits. We discussed the following Behavioral Modification Strategies today: increasing lean protein intake, decreasing simple carbohydrates, no skipping meals, and travel eating strategies    Adysen has agreed to follow up with our clinic in 2 weeks. She was informed of the importance of frequent follow up visits to maximize her success with intensive lifestyle modifications for her multiple health conditions.   OBESITY BEHAVIORAL INTERVENTION  VISIT  Today's visit was # 6   Starting weight: 280 lbs Starting date: 12/19/17 Today's weight : 252 lbs  Today's date: 03/28/2018 Total lbs lost to date: 28 At least 15 minutes were spent on discussing the following behavioral intervention visit.   ASK: We discussed the diagnosis of obesity with Amalia Hailey today and Coralynn agreed to give [place] permission to discuss obesity behavioral modification therapy today.  ASSESS: Keylah has the diagnosis of obesity and her BMI today is 41.93 Kynzley is in the action stage of change   ADVISE: Yashvi was educated on the multiple health risks of obesity as well as the benefit of weight loss to improve her health. She was advised of the need for long term treatment and the importance of lifestyle modifications to improve her current health and to decrease her risk of future health problems.  AGREE: Multiple dietary modification options and treatment options were discussed and  Mackensi agreed to follow the recommendations documented in the above note.  ARRANGE: Genora was educated on the importance of frequent visits to treat obesity as outlined per CMS and USPSTF guidelines and agreed to schedule her next follow up appointment today.  I, Burt Knack, am acting as transcriptionist for Quillian Quince, MD  I have reviewed the above documentation for accuracy and completeness, and I agree with the above. -Quillian Quince, MD

## 2018-04-16 ENCOUNTER — Other Ambulatory Visit (INDEPENDENT_AMBULATORY_CARE_PROVIDER_SITE_OTHER): Payer: Self-pay | Admitting: Family Medicine

## 2018-04-16 DIAGNOSIS — E559 Vitamin D deficiency, unspecified: Secondary | ICD-10-CM

## 2018-04-19 ENCOUNTER — Ambulatory Visit (INDEPENDENT_AMBULATORY_CARE_PROVIDER_SITE_OTHER): Payer: 59 | Admitting: Family Medicine

## 2018-04-19 VITALS — BP 103/65 | HR 64 | Temp 98.1°F | Ht 65.0 in | Wt 250.0 lb

## 2018-04-19 DIAGNOSIS — Z6841 Body Mass Index (BMI) 40.0 and over, adult: Secondary | ICD-10-CM

## 2018-04-19 DIAGNOSIS — Z9189 Other specified personal risk factors, not elsewhere classified: Secondary | ICD-10-CM | POA: Diagnosis not present

## 2018-04-19 DIAGNOSIS — E7849 Other hyperlipidemia: Secondary | ICD-10-CM | POA: Diagnosis not present

## 2018-04-19 DIAGNOSIS — E559 Vitamin D deficiency, unspecified: Secondary | ICD-10-CM | POA: Diagnosis not present

## 2018-04-19 DIAGNOSIS — R7303 Prediabetes: Secondary | ICD-10-CM

## 2018-04-19 MED ORDER — VITAMIN D (ERGOCALCIFEROL) 1.25 MG (50000 UNIT) PO CAPS: 50000.0000 [IU] | ORAL_CAPSULE | ORAL | 0 refills | Status: DC

## 2018-04-19 NOTE — Progress Notes (Signed)
Office: [phone]  /  Fax: [phone]   HPI:   Chief Complaint: OBESITY Michelle Webster is here to discuss her progress with her obesity treatment plan. She is on the Category 3 plan and is following her eating plan approximately 75 % of the time. She states she is exercising 0 minutes 0 times per week. Michelle Webster continues to do well with weight loss on Category 3, even while on vacation. She is getting ready to change jobs and will be out of insurance for October. She would like to get labs that are due now while she still has insurance. Her weight is 250 lb (113.4 kg) today and has had a weight loss of 2 pounds over a period of 3 weeks since her last visit. She has lost 30 lbs since starting treatment with us.  Vitamin D deficiency Michelle Webster has a diagnosis of vitamin D deficiency. She is currently taking prescription vit D and due for labs today.  Pre-Diabetes Michelle Webster has a diagnosis of pre-diabetes based on her elevated Hgb A1c and was informed that this puts her at greater risk of developing diabetes. She is taking metformin currently, and working on diet and exercise to decrease her risk of diabetes. Sheis still struggling with some polyphagia. She denies nausea, vomiting, or hypoglycemia. She is due for labs today.  At risk for diabetes Michelle Webster is at higher than average risk for developing diabetes due to her pre-diabetes and obesity.   Hyperlipidemia Michelle Webster has hyperlipidemia and has been trying to improve her cholesterol levels with diet, including a low saturated fat diet, exercise, and weight loss. She is not on a statin and denies any chest pain or myalgias.  ALLERGIES: Allergies  Allergen Reactions  . Vicodin [Hydrocodone-Acetaminophen] Itching    MEDICATIONS: Current Outpatient Medications on File Prior to Visit  Medication Sig Dispense Refill  . ASTRAGALUS PO Take 1 tablet by mouth daily.    [name]. b complex vitamins tablet Take 1 tablet by mouth daily.    . cetirizine (ZYRTEC) 10 MG  tablet Take 10 mg by mouth daily as needed.     . citalopram (CELEXA) 40 MG tablet Take 40 mg by mouth daily.     [name]. docusate sodium (COLACE) 100 MG capsule Take 100 mg by mouth 2 (two) times daily.    . fluticasone (FLONASE) 50 MCG/ACT nasal spray Place 2 sprays into both nostrils daily as needed.     . linaclotide (LINZESS) 290 MCG CAPS capsule Take 290 mcg by mouth daily before breakfast.    . metFORMIN (GLUCOPHAGE-XR) 500 MG 24 hr tablet Take 500 mg by mouth 2 (two) times daily.  11  . omeprazole (PRILOSEC) 20 MG capsule Take 20 mg by mouth 2 (two) times daily before a meal.     . Turmeric Curcumin 500 MG CAPS Take 1 capsule by mouth daily.     No current facility-administered medications on file prior to visit.     PAST MEDICAL HISTORY: Past Medical History:  Diagnosis Date  . Anxiety   . Back pain   . Constipation   . Dyspnea   . Eczema   . Fatty liver   . GERD (gastroesophageal reflux disease)   . HLD (hyperlipidemia)   . IBS (irritable bowel syndrome)   . Joint pain   . Morbid obesity (HCC)   . Neurotic depression   . OSA (obstructive sleep apnea)   . Palpitations   . Prediabetes   . Vitamin D deficiency     PAST  SURGICAL HISTORY: Past Surgical History:  Procedure Laterality Date  . BREAST EXCISIONAL BIOPSY Right   . GALLBLADDER SURGERY    . LUM PECTOMY      SOCIAL HISTORY: Social History   Tobacco Use  . Smoking status: Never Smoker  . Smokeless tobacco: Never Used  Substance Use Topics  . Alcohol use: No  . Drug use: Not on file    FAMILY HISTORY: Family History  Problem Relation Age of Onset  . Depression Mother        ECT  . Hypertension Mother   . Hyperlipidemia Mother   . Anxiety disorder Mother   . Obesity Mother   . Hypertension Father   . Other Father        cardiac ablations x 2  . Hyperlipidemia Father   . Depression Father   . Anxiety disorder Father   . Sleep apnea Father   . Obesity Father     ROS: Review of Systems    Constitutional: Positive for weight loss.  Cardiovascular: Negative for chest pain.  Gastrointestinal: Negative for nausea and vomiting.  Genitourinary:       Positive for polyphagia.  Musculoskeletal: Negative for myalgias.  Endo/Heme/Allergies:       Negative for hypoglycemia.    PHYSICAL EXAM: Blood pressure 103/65, pulse 64, temperature 98.1 F (36.7 C), temperature source Oral, height 5\' 5"  (1.651 m), weight 250 lb (113.4 kg), SpO2 99 %. Body mass index is 41.6 kg/m. Physical Exam  Constitutional: She is oriented to person, place, and time. She appears well-developed and well-nourished.  Cardiovascular: Normal rate.  Pulmonary/Chest: Effort normal.  Musculoskeletal: Normal range of motion.  Neurological: She is oriented to person, place, and time.  Skin: Skin is warm and dry.  Psychiatric: She has a normal mood and affect. Her behavior is normal.  Vitals reviewed.   RECENT LABS AND TESTS: BMET    Component Value Date/Time   NA 141 12/19/2017 0952   K 4.5 12/19/2017 0952   CL 104 12/19/2017 0952   CO2 20 12/19/2017 0952   GLUCOSE 89 12/19/2017 0952   BUN 11 12/19/2017 0952   CREATININE 0.72 12/19/2017 0952   CALCIUM 9.6 12/19/2017 0952   GFRNONAA 99 12/19/2017 0952   GFRAA 114 12/19/2017 0952   Lab Results  Component Value Date   HGBA1C 5.9 (H) 12/19/2017   Lab Results  Component Value Date   INSULIN 31.1 (H) 12/19/2017   CBC    Component Value Date/Time   WBC 6.5 12/19/2017 0952   RBC 4.54 12/19/2017 0952   HGB 12.5 12/19/2017 0952   HCT 38.2 12/19/2017 0952   MCV 84 12/19/2017 0952   MCH 27.5 12/19/2017 0952   MCHC 32.7 12/19/2017 0952   RDW 14.3 12/19/2017 0952   LYMPHSABS 1.9 12/19/2017 0952   EOSABS 0.1 12/19/2017 0952   BASOSABS 0.0 12/19/2017 0952   Iron/TIBC/Ferritin/ %Sat No results found for: IRON, TIBC, FERRITIN, IRONPCTSAT Lipid Panel     Component Value Date/Time   CHOL 194 12/19/2017 0952   TRIG 244 (H) 12/19/2017 0952   HDL  39 (L) 12/19/2017 0952   LDLCALC 106 (H) 12/19/2017 0952   Hepatic Function Panel     Component Value Date/Time   PROT 6.9 12/19/2017 0952   ALBUMIN 4.3 12/19/2017 0952   AST 18 12/19/2017 0952   ALT 17 12/19/2017 0952   ALKPHOS 81 12/19/2017 0952   BILITOT 0.3 12/19/2017 0952      Component Value Date/Time   TSH  3.610 12/19/2017 0952   Results for SHARRAN, CARATACHEA (MRN [id number]) as of 04/19/2018 16:12  Ref. Range 12/19/2017 09:52  Vitamin D, 25-Hydroxy Latest Ref Range: 30.0 - 100.0 ng/mL 34.5   ASSESSMENT AND PLAN: Vitamin D deficiency - Plan: CBC With Differential, VITAMIN D 25 Hydroxy (Vit-D Deficiency, Fractures), Vitamin D, Ergocalciferol, (DRISDOL) 50000 units CAPS capsule  Prediabetes - Plan: Comprehensive metabolic panel, Hemoglobin A1c, Insulin, random, Vitamin B12, Folate  Other hyperlipidemia - Plan: Lipid Panel With LDL/HDL Ratio  At risk for diabetes mellitus  Class 3 severe obesity with serious comorbidity and body mass index (BMI) of 40.0 to 44.9 in adult, unspecified obesity type (HCC)  PLAN: Vitamin D Deficiency Radie was informed that low vitamin D levels contributes to fatigue and are associated with obesity, breast, and colon cancer. She agrees to continue to take prescription Vit D @50 ,000 IU every week x 90 days with no refills and will follow up for routine testing of vitamin D, at least 2-3 times per year. She was informed of the risk of over-replacement of vitamin D and agrees to not increase her dose unless she discusses this with [place] first. She agreed to follow up in 6 to 7 weeks.  Pre-Diabetes Lonie will continue to work on weight loss, exercise, and decreasing simple carbohydrates in her diet to help decrease the risk of diabetes. We dicussed metformin including benefits and risks. She was informed that eating too many simple carbohydrates or too many calories at one sitting increases the likelihood of GI side effects. Pariss is going to continue taking  metformin and her diet plan. We will draw labs today and Rosalina agreed to follow up with [place] as directed to monitor her progress in 6 to 7 weeks.  Diabetes risk counseling Avion was given extended (15 minutes) diabetes prevention counseling today. She is 52 y.o. female and has risk factors for diabetes including pre-diabetes and obesity. We discussed intensive lifestyle modifications today with an emphasis on weight loss as well as increasing exercise and decreasing simple carbohydrates in her diet.  Hyperlipidemia Anorah was informed of the American Heart Association Guidelines emphasizing intensive lifestyle modifications as the first line treatment for hyperlipidemia. We discussed many lifestyle modifications today in depth, and Dian will continue to work on decreasing saturated fats such as fatty red meat, butter and many fried foods. She will also increase vegetables and lean protein in her diet.She agrees to continue to work on diet, exercise, and weight loss efforts. We will check labs today.  Obesity Sondi is currently in the action stage of change. As such, her goal is to maintain weight for now. She has agreed to follow the Category 3 plan. Latrenda has been instructed to work up to a goal of 150 minutes of combined cardio and strengthening exercise per week for weight loss and overall health benefits. We discussed the following Behavioral Modification Strategies today: increasing lean protein intake, decreasing simple carbohydrates, no skipping meals, meal planning and cooking strategies, and better snacking choices.  Adrienne has agreed to follow up with our clinic in 6 to 7 weeks when her new insurance starts.. She was informed of the importance of frequent follow up visits to maximize her success with intensive lifestyle modifications for her multiple health conditions.   OBESITY BEHAVIORAL INTERVENTION VISIT  Today's visit was # 7  Starting weight: 280 lbs Starting date:  12/19/17 Today's weight : Weight: 250 lb (113.4 kg)  Today's date: 04/19/2018 Total lbs lost to date: 30  ASK: We discussed the diagnosis of obesity with Amalia Hailey today and Cornelius agreed to give [place] permission to discuss obesity behavioral modification therapy today.  ASSESS: Verdia has the diagnosis of obesity and her BMI today is 41.6. Delrose is in the action stage of change.   ADVISE: Karigan was educated on the multiple health risks of obesity as well as the benefit of weight loss to improve her health. She was advised of the need for long term treatment and the importance of lifestyle modifications to improve her current health and to decrease her risk of future health problems.  AGREE: Multiple dietary modification options and treatment options were discussed and Tanai agreed to follow the recommendations documented in the above note.  ARRANGE: Ashani was educated on the importance of frequent visits to treat obesity as outlined per CMS and USPSTF guidelines and agreed to schedule her next follow up appointment today.  I, Kirke Corin, am acting as transcriptionist for Wilder Glade, MD  I have reviewed the above documentation for accuracy and completeness, and I agree with the above. -Quillian Quince, MD

## 2018-04-20 LAB — COMPREHENSIVE METABOLIC PANEL
A/G RATIO: 1.4 (ref 1.2–2.2)
ALT: 11 IU/L (ref 0–32)
AST: 14 IU/L (ref 0–40)
Albumin: 4.3 g/dL (ref 3.5–5.5)
Alkaline Phosphatase: 98 IU/L (ref 39–117)
BUN/Creatinine Ratio: 20 (ref 9–23)
BUN: 15 mg/dL (ref 6–24)
Bilirubin Total: 0.2 mg/dL (ref 0.0–1.2)
CALCIUM: 9.7 mg/dL (ref 8.7–10.2)
CO2: 20 mmol/L (ref 20–29)
Chloride: 102 mmol/L (ref 96–106)
Creatinine, Ser: 0.76 mg/dL (ref 0.57–1.00)
GFR, EST AFRICAN AMERICAN: 106 mL/min/{1.73_m2} (ref >59)
GFR, EST NON AFRICAN AMERICAN: 92 mL/min/{1.73_m2} (ref >59)
Globulin, Total: 3 g/dL (ref 1.5–4.5)
Glucose: 93 mg/dL (ref 65–99)
POTASSIUM: 4.5 mmol/L (ref 3.5–5.2)
Sodium: 140 mmol/L (ref 134–144)
TOTAL PROTEIN: 7.3 g/dL (ref 6.0–8.5)

## 2018-04-20 LAB — CBC WITH DIFFERENTIAL
Basophils Absolute: 0 10*3/uL (ref 0.0–0.2)
Basos: 1 %
EOS (ABSOLUTE): 0.2 10*3/uL (ref 0.0–0.4)
EOS: 2 %
HEMATOCRIT: 41.2 % (ref 34.0–46.6)
HEMOGLOBIN: 12.7 g/dL (ref 11.1–15.9)
Immature Grans (Abs): 0 10*3/uL (ref 0.0–0.1)
Immature Granulocytes: 0 %
LYMPHS ABS: 1.9 10*3/uL (ref 0.7–3.1)
Lymphs: 25 %
MCH: 26.2 pg — AB (ref 26.6–33.0)
MCHC: 30.8 g/dL — ABNORMAL LOW (ref 31.5–35.7)
MCV: 85 fL (ref 79–97)
MONOCYTES: 5 %
Monocytes Absolute: 0.4 10*3/uL (ref 0.1–0.9)
NEUTROS ABS: 5.3 10*3/uL (ref 1.4–7.0)
Neutrophils: 67 %
RBC: 4.85 x10E6/uL (ref 3.77–5.28)
RDW: 13.6 % (ref 12.3–15.4)
WBC: 7.8 10*3/uL (ref 3.4–10.8)

## 2018-04-20 LAB — LIPID PANEL WITH LDL/HDL RATIO
Cholesterol, Total: 168 mg/dL (ref 100–199)
HDL: 39 mg/dL — AB (ref >39)
LDL CALC: 88 mg/dL (ref 0–99)
LDl/HDL Ratio: 2.3 ratio (ref 0.0–3.2)
TRIGLYCERIDES: 205 mg/dL — AB (ref 0–149)
VLDL Cholesterol Cal: 41 mg/dL — ABNORMAL HIGH (ref 5–40)

## 2018-04-20 LAB — HEMOGLOBIN A1C
Est. average glucose Bld gHb Est-mCnc: 114 mg/dL
Hgb A1c MFr Bld: 5.6 % (ref 4.8–5.6)

## 2018-04-20 LAB — VITAMIN B12: Vitamin B-12: 486 pg/mL (ref 232–1245)

## 2018-04-20 LAB — FOLATE: Folate: >20 ng/mL (ref >3.0)

## 2018-04-20 LAB — INSULIN, RANDOM: INSULIN: 22.7 u[IU]/mL (ref 2.6–24.9)

## 2018-04-20 LAB — VITAMIN D 25 HYDROXY (VIT D DEFICIENCY, FRACTURES): Vit D, 25-Hydroxy: 38.1 ng/mL (ref 30.0–100.0)

## 2018-05-28 ENCOUNTER — Encounter (INDEPENDENT_AMBULATORY_CARE_PROVIDER_SITE_OTHER): Payer: Self-pay | Admitting: Family Medicine

## 2018-05-28 NOTE — Telephone Encounter (Signed)
April, can you send this over?

## 2018-05-31 ENCOUNTER — Ambulatory Visit (INDEPENDENT_AMBULATORY_CARE_PROVIDER_SITE_OTHER): Payer: 59 | Admitting: Family Medicine

## 2018-06-11 ENCOUNTER — Ambulatory Visit (INDEPENDENT_AMBULATORY_CARE_PROVIDER_SITE_OTHER): Payer: BLUE CROSS/BLUE SHIELD | Admitting: Family Medicine

## 2018-06-11 ENCOUNTER — Encounter (INDEPENDENT_AMBULATORY_CARE_PROVIDER_SITE_OTHER): Payer: Self-pay | Admitting: Family Medicine

## 2018-06-11 VITALS — BP 114/71 | HR 68 | Temp 98.7°F | Ht 65.0 in | Wt 255.0 lb

## 2018-06-11 DIAGNOSIS — E559 Vitamin D deficiency, unspecified: Secondary | ICD-10-CM | POA: Diagnosis not present

## 2018-06-11 DIAGNOSIS — Z6841 Body Mass Index (BMI) 40.0 and over, adult: Secondary | ICD-10-CM | POA: Diagnosis not present

## 2018-06-11 DIAGNOSIS — Z9189 Other specified personal risk factors, not elsewhere classified: Secondary | ICD-10-CM | POA: Diagnosis not present

## 2018-06-11 MED ORDER — VITAMIN D (ERGOCALCIFEROL) 1.25 MG (50000 UNIT) PO CAPS: 50000.0000 [IU] | ORAL_CAPSULE | ORAL | 0 refills | Status: DC

## 2018-06-11 NOTE — Progress Notes (Signed)
Office: [phone]  /  Fax: [phone]   HPI:   Chief Complaint: OBESITY Michelle Webster is here to discuss her progress with her obesity treatment plan. She is on the Category 3 plan and is following her eating plan approximately 40 % of the time. She states she is exercising 0 minutes 0 times per week. Michelle Webster has increased eating out and is more sedentary in her new job. She has stopped meal planning and doing more grab and go. She notes more fatigue and feeling bloated.  Her weight is 255 lb (115.7 kg) today and has had a weight gain of 5 pounds over a period of 6 weeks since her last visit. She has lost 25 lbs since starting treatment with [place].  Vitamin D deficiency Michelle Webster has a diagnosis of vitamin D deficiency. She is currently stable on vit D and denies nausea, vomiting or muscle weakness.  At risk for osteopenia and osteoporosis Michelle Webster is at higher risk of osteopenia and osteoporosis due to vitamin D deficiency.   ALLERGIES: Allergies  Allergen Reactions  . Vicodin [Hydrocodone-Acetaminophen] Itching    MEDICATIONS: Current Outpatient Medications on File Prior to Visit  Medication Sig Dispense Refill  . ASTRAGALUS PO Take 1 tablet by mouth daily.    [name] b complex vitamins tablet Take 1 tablet by mouth daily.    . cetirizine (ZYRTEC) 10 MG tablet Take 10 mg by mouth daily as needed.     . citalopram (CELEXA) 40 MG tablet Take 40 mg by mouth daily.     [name] docusate sodium (COLACE) 100 MG capsule Take 100 mg by mouth 2 (two) times daily.    . fluticasone (FLONASE) 50 MCG/ACT nasal spray Place 2 sprays into both nostrils daily as needed.     . linaclotide (LINZESS) 290 MCG CAPS capsule Take 290 mcg by mouth daily before breakfast.    . metFORMIN (GLUCOPHAGE-XR) 500 MG 24 hr tablet Take 500 mg by mouth 2 (two) times daily.  11  . omeprazole (PRILOSEC) 20 MG capsule Take 20 mg by mouth 2 (two) times daily before a meal.     . Turmeric Curcumin 500 MG CAPS Take 1 capsule by mouth daily.      No current facility-administered medications on file prior to visit.     PAST MEDICAL HISTORY: Past Medical History:  Diagnosis Date  . Anxiety   . Back pain   . Constipation   . Dyspnea   . Eczema   . Fatty liver   . GERD (gastroesophageal reflux disease)   . HLD (hyperlipidemia)   . IBS (irritable bowel syndrome)   . Joint pain   . Morbid obesity (HCC)   . Neurotic depression   . OSA (obstructive sleep apnea)   . Palpitations   . Prediabetes   . Vitamin D deficiency     PAST SURGICAL HISTORY: Past Surgical History:  Procedure Laterality Date  . BREAST EXCISIONAL BIOPSY Right   . GALLBLADDER SURGERY    . LUM PECTOMY      SOCIAL HISTORY: Social History   Tobacco Use  . Smoking status: Never Smoker  . Smokeless tobacco: Never Used  Substance Use Topics  . Alcohol use: No  . Drug use: Not on file    FAMILY HISTORY: Family History  Problem Relation Age of Onset  . Depression Mother        ECT  . Hypertension Mother   . Hyperlipidemia Mother   . Anxiety disorder Mother   . Obesity Mother   .  Hypertension Father   . Other Father        cardiac ablations x 2  . Hyperlipidemia Father   . Depression Father   . Anxiety disorder Father   . Sleep apnea Father   . Obesity Father     ROS: Review of Systems  Constitutional: Positive for malaise/fatigue. Negative for weight loss.  Gastrointestinal: Negative for nausea and vomiting.       Positive for bloating.  Musculoskeletal:       Negative for muscle weakness.    PHYSICAL EXAM: Blood pressure 114/71, pulse 68, temperature 98.7 F (37.1 C), temperature source Oral, height 5\' 5"  (1.651 m), weight 255 lb (115.7 kg), SpO2 98 %. Body mass index is 42.43 kg/m. Physical Exam  Constitutional: She is oriented to person, place, and time. She appears well-developed and well-nourished.  Cardiovascular: Normal rate.  Pulmonary/Chest: Effort normal.  Musculoskeletal: Normal range of motion.  Neurological:  She is oriented to person, place, and time.  Skin: Skin is warm and dry.  Psychiatric: She has a normal mood and affect. Her behavior is normal.  Vitals reviewed.   RECENT LABS AND TESTS: BMET    Component Value Date/Time   NA 140 04/19/2018 0754   K 4.5 04/19/2018 0754   CL 102 04/19/2018 0754   CO2 20 04/19/2018 0754   GLUCOSE 93 04/19/2018 0754   BUN 15 04/19/2018 0754   CREATININE 0.76 04/19/2018 0754   CALCIUM 9.7 04/19/2018 0754   GFRNONAA 92 04/19/2018 0754   GFRAA 106 04/19/2018 0754   Lab Results  Component Value Date   HGBA1C 5.6 04/19/2018   HGBA1C 5.9 (H) 12/19/2017   Lab Results  Component Value Date   INSULIN 22.7 04/19/2018   INSULIN 31.1 (H) 12/19/2017   CBC    Component Value Date/Time   WBC 7.8 04/19/2018 0754   RBC 4.85 04/19/2018 0754   HGB 12.7 04/19/2018 0754   HCT 41.2 04/19/2018 0754   MCV 85 04/19/2018 0754   MCH 26.2 (L) 04/19/2018 0754   MCHC 30.8 (L) 04/19/2018 0754   RDW 13.6 04/19/2018 0754   LYMPHSABS 1.9 04/19/2018 0754   EOSABS 0.2 04/19/2018 0754   BASOSABS 0.0 04/19/2018 0754   Iron/TIBC/Ferritin/ %Sat No results found for: IRON, TIBC, FERRITIN, IRONPCTSAT Lipid Panel     Component Value Date/Time   CHOL 168 04/19/2018 0754   TRIG 205 (H) 04/19/2018 0754   HDL 39 (L) 04/19/2018 0754   LDLCALC 88 04/19/2018 0754   Hepatic Function Panel     Component Value Date/Time   PROT 7.3 04/19/2018 0754   ALBUMIN 4.3 04/19/2018 0754   AST 14 04/19/2018 0754   ALT 11 04/19/2018 0754   ALKPHOS 98 04/19/2018 0754   BILITOT 0.2 04/19/2018 0754      Component Value Date/Time   TSH 3.610 12/19/2017 0952   Results for Poarch, Ariona (MRN [id number]) as of 06/11/2018 12:50  Ref. Range 04/19/2018 07:54  Vitamin D, 25-Hydroxy Latest Ref Range: 30.0 - 100.0 ng/mL 38.1   ASSESSMENT AND PLAN: Vitamin D deficiency - Plan: Vitamin D, Ergocalciferol, (DRISDOL) 50000 units CAPS capsule  At risk for osteoporosis  Class 3 severe obesity  with serious comorbidity and body mass index (BMI) of 40.0 to 44.9 in adult, unspecified obesity type (HCC)  PLAN:  Vitamin D Deficiency Michelle Webster was informed that low vitamin D levels contributes to fatigue and are associated with obesity, breast, and colon cancer. She agrees to continue to take prescription Vit D @50 ,000  IU every week #4 with no refills and will follow up for routine testing of vitamin D, at least 2-3 times per year. She was informed of the risk of over-replacement of vitamin D and agrees to not increase her dose unless she discusses this with [place] first. We will recheck labs in 4 to 6 weeks. Michelle Webster agrees to follow up in 2 weeks.  At risk for osteopenia and osteoporosis Michelle Webster was given extended (15 minutes) osteoporosis prevention counseling today. Michelle Webster is at risk for osteopenia and osteoporosis due to her vitamin D deficiency. She was encouraged to take her vitamin D and follow her higher calcium diet and increase strengthening exercise to help strengthen her bones and decrease her risk of osteopenia and osteoporosis.  Obesity Michelle Webster is currently in the action stage of change. As such, her goal is to continue with weight loss efforts She has agreed to follow the Category 3 plan. Michelle Webster has been instructed to work up to a goal of 150 minutes of combined cardio and strengthening exercise per week for weight loss and overall health benefits. We discussed the following Behavioral Modification Strategies today: increasing lean protein intake, decreasing simple carbohydrates, and work on meal planning and easy cooking plans.  Michelle Webster has agreed to follow up with our clinic in 2 weeks. She was informed of the importance of frequent follow up visits to maximize her success with intensive lifestyle modifications for her multiple health conditions.   OBESITY BEHAVIORAL INTERVENTION VISIT  Today's visit was # 8   Starting weight: 280 lbs Starting date: 12/19/17 Today's weight : Weight: 255  lb (115.7 kg)  Today's date: 06/11/2018 Total lbs lost to date: 25  ASK: We discussed the diagnosis of obesity with Michelle Webster today and Michelle Webster agreed to give [place] permission to discuss obesity behavioral modification therapy today.  ASSESS: Michelle Webster has the diagnosis of obesity and her BMI today is 42.43. Michelle Webster is in the action stage of change.   ADVISE: Michelle Webster was educated on the multiple health risks of obesity as well as the benefit of weight loss to improve her health. She was advised of the need for long term treatment and the importance of lifestyle modifications to improve her current health and to decrease her risk of future health problems.  AGREE: Multiple dietary modification options and treatment options were discussed and Michelle Webster agreed to follow the recommendations documented in the above note.  ARRANGE: Michelle Webster was educated on the importance of frequent visits to treat obesity as outlined per CMS and USPSTF guidelines and agreed to schedule her next follow up appointment today.  I, Kirke Corin, am acting as transcriptionist for Wilder Glade, MD  I have reviewed the above documentation for accuracy and completeness, and I agree with the above. -Quillian Quince, MD

## 2018-06-12 ENCOUNTER — Telehealth (INDEPENDENT_AMBULATORY_CARE_PROVIDER_SITE_OTHER): Payer: Self-pay | Admitting: Family Medicine

## 2018-06-12 NOTE — Telephone Encounter (Signed)
Patient called request copy of labs dos 04/19/18 be faxed to primary doctor/[facility]. [facility] fax [phone]. Patient stated this is needed this week.  Thank you!    Elnita Maxwell

## 2018-06-12 NOTE — Telephone Encounter (Signed)
Sent the most recent labs to her PCP. April, CMA

## 2018-06-14 NOTE — Telephone Encounter (Signed)
error 

## 2018-06-25 DIAGNOSIS — K219 Gastro-esophageal reflux disease without esophagitis: Secondary | ICD-10-CM | POA: Diagnosis not present

## 2018-06-25 DIAGNOSIS — Z0000 Encounter for general adult medical examination without abnormal findings: Secondary | ICD-10-CM | POA: Diagnosis not present

## 2018-06-25 DIAGNOSIS — F4321 Adjustment disorder with depressed mood: Secondary | ICD-10-CM | POA: Diagnosis not present

## 2018-06-25 DIAGNOSIS — R7301 Impaired fasting glucose: Secondary | ICD-10-CM | POA: Diagnosis not present

## 2018-06-25 DIAGNOSIS — E781 Pure hyperglyceridemia: Secondary | ICD-10-CM | POA: Diagnosis not present

## 2018-06-27 ENCOUNTER — Ambulatory Visit (INDEPENDENT_AMBULATORY_CARE_PROVIDER_SITE_OTHER): Payer: BLUE CROSS/BLUE SHIELD | Admitting: Family Medicine

## 2018-06-28 ENCOUNTER — Ambulatory Visit (INDEPENDENT_AMBULATORY_CARE_PROVIDER_SITE_OTHER): Payer: BLUE CROSS/BLUE SHIELD | Admitting: Family Medicine

## 2018-06-28 ENCOUNTER — Encounter (INDEPENDENT_AMBULATORY_CARE_PROVIDER_SITE_OTHER): Payer: Self-pay | Admitting: Family Medicine

## 2018-06-28 VITALS — BP 98/60 | HR 79 | Temp 97.6°F | Ht 65.0 in | Wt 251.0 lb

## 2018-06-28 DIAGNOSIS — E559 Vitamin D deficiency, unspecified: Secondary | ICD-10-CM | POA: Diagnosis not present

## 2018-06-28 DIAGNOSIS — Z6841 Body Mass Index (BMI) 40.0 and over, adult: Secondary | ICD-10-CM

## 2018-07-02 NOTE — Progress Notes (Signed)
Office: [phone]  /  Fax: [phone]   HPI:   Chief Complaint: OBESITY Michelle Webster is here to discuss her progress with her obesity treatment plan. She is on the Category 3 plan and is following her eating plan approximately 75 % of the time. She states she is exercising 0 minutes 0 times per week. Michelle Webster continues to do very well with weight loss on her Category 3 plan. She has been sick and deviated more, especially decreasing lean protein.  Her weight is 251 lb (113.9 kg) today and has had a weight loss of 4 pounds over a period of 2 to 3 weeks since her last visit. She has lost 29 lbs since starting treatment with us.  Vitamin D Deficiency Michelle Webster has a diagnosis of vitamin D deficiency. She is stable on prescription Vit D and denies nausea, vomiting or muscle weakness.  ALLERGIES: Allergies  Allergen Reactions  . Vicodin [Hydrocodone-Acetaminophen] Itching    MEDICATIONS: Current Outpatient Medications on File Prior to Visit  Medication Sig Dispense Refill  . ASTRAGALUS PO Take 1 tablet by mouth daily.    [name]. b complex vitamins tablet Take 1 tablet by mouth daily.    . cetirizine (ZYRTEC) 10 MG tablet Take 10 mg by mouth daily as needed.     . citalopram (CELEXA) 40 MG tablet Take 40 mg by mouth daily.     [name]. docusate sodium (COLACE) 100 MG capsule Take 100 mg by mouth 2 (two) times daily.    . fluticasone (FLONASE) 50 MCG/ACT nasal spray Place 2 sprays into both nostrils daily as needed.     . linaclotide (LINZESS) 290 MCG CAPS capsule Take 290 mcg by mouth daily before breakfast.    . metFORMIN (GLUCOPHAGE-XR) 500 MG 24 hr tablet Take 500 mg by mouth 2 (two) times daily.  11  . omeprazole (PRILOSEC) 20 MG capsule Take 20 mg by mouth 2 (two) times daily before a meal.     . Turmeric Curcumin 500 MG CAPS Take 1 capsule by mouth daily.    . Vitamin D, Ergocalciferol, (DRISDOL) 50000 units CAPS capsule Take 1 capsule (50,000 Units total) by mouth every 7 (seven) days. 4 capsule 0    No current facility-administered medications on file prior to visit.     PAST MEDICAL HISTORY: Past Medical History:  Diagnosis Date  . Anxiety   . Back pain   . Constipation   . Dyspnea   . Eczema   . Fatty liver   . GERD (gastroesophageal reflux disease)   . HLD (hyperlipidemia)   . IBS (irritable bowel syndrome)   . Joint pain   . Morbid obesity (HCC)   . Neurotic depression   . OSA (obstructive sleep apnea)   . Palpitations   . Prediabetes   . Vitamin D deficiency     PAST SURGICAL HISTORY: Past Surgical History:  Procedure Laterality Date  . BREAST EXCISIONAL BIOPSY Right   . GALLBLADDER SURGERY    . LUM PECTOMY      SOCIAL HISTORY: Social History   Tobacco Use  . Smoking status: Never Smoker  . Smokeless tobacco: Never Used  Substance Use Topics  . Alcohol use: No  . Drug use: Not on file    FAMILY HISTORY: Family History  Problem Relation Age of Onset  . Depression Mother        ECT  . Hypertension Mother   . Hyperlipidemia Mother   . Anxiety disorder Mother   . Obesity Mother   .  Hypertension Father   . Other Father        cardiac ablations x 2  . Hyperlipidemia Father   . Depression Father   . Anxiety disorder Father   . Sleep apnea Father   . Obesity Father     ROS: Review of Systems  Constitutional: Positive for weight loss.  Gastrointestinal: Negative for nausea and vomiting.  Musculoskeletal:       Negative muscle weakness    PHYSICAL EXAM: Blood pressure 98/60, pulse 79, temperature 97.6 F (36.4 C), temperature source Oral, height 5\' 5"  (1.651 m), weight 251 lb (113.9 kg), SpO2 97 %. Body mass index is 41.77 kg/m. Physical Exam  Constitutional: She is oriented to person, place, and time. She appears well-developed and well-nourished.  Cardiovascular: Normal rate.  Pulmonary/Chest: Effort normal.  Musculoskeletal: Normal range of motion.  Neurological: She is oriented to person, place, and time.  Skin: Skin is warm and  dry.  Psychiatric: She has a normal mood and affect. Her behavior is normal.  Vitals reviewed.   RECENT LABS AND TESTS: BMET    Component Value Date/Time   NA 140 04/19/2018 0754   K 4.5 04/19/2018 0754   CL 102 04/19/2018 0754   CO2 20 04/19/2018 0754   GLUCOSE 93 04/19/2018 0754   BUN 15 04/19/2018 0754   CREATININE 0.76 04/19/2018 0754   CALCIUM 9.7 04/19/2018 0754   GFRNONAA 92 04/19/2018 0754   GFRAA 106 04/19/2018 0754   Lab Results  Component Value Date   HGBA1C 5.6 04/19/2018   HGBA1C 5.9 (H) 12/19/2017   Lab Results  Component Value Date   INSULIN 22.7 04/19/2018   INSULIN 31.1 (H) 12/19/2017   CBC    Component Value Date/Time   WBC 7.8 04/19/2018 0754   RBC 4.85 04/19/2018 0754   HGB 12.7 04/19/2018 0754   HCT 41.2 04/19/2018 0754   MCV 85 04/19/2018 0754   MCH 26.2 (L) 04/19/2018 0754   MCHC 30.8 (L) 04/19/2018 0754   RDW 13.6 04/19/2018 0754   LYMPHSABS 1.9 04/19/2018 0754   EOSABS 0.2 04/19/2018 0754   BASOSABS 0.0 04/19/2018 0754   Iron/TIBC/Ferritin/ %Sat No results found for: IRON, TIBC, FERRITIN, IRONPCTSAT Lipid Panel     Component Value Date/Time   CHOL 168 04/19/2018 0754   TRIG 205 (H) 04/19/2018 0754   HDL 39 (L) 04/19/2018 0754   LDLCALC 88 04/19/2018 0754   Hepatic Function Panel     Component Value Date/Time   PROT 7.3 04/19/2018 0754   ALBUMIN 4.3 04/19/2018 0754   AST 14 04/19/2018 0754   ALT 11 04/19/2018 0754   ALKPHOS 98 04/19/2018 0754   BILITOT 0.2 04/19/2018 0754      Component Value Date/Time   TSH 3.610 12/19/2017 0952  Results for Alers, Riki (MRN [id number]) as of 07/02/2018 16:44  Ref. Range 04/19/2018 07:54  Vitamin D, 25-Hydroxy Latest Ref Range: 30.0 - 100.0 ng/mL 38.1    ASSESSMENT AND PLAN: Vitamin D deficiency  Class 3 severe obesity with serious comorbidity and body mass index (BMI) of 40.0 to 44.9 in adult, unspecified obesity type (HCC)  PLAN:  Vitamin D Deficiency Chele was informed that  low vitamin D levels contributes to fatigue and are associated with obesity, breast, and colon cancer. Nadiyah agrees to continue taking prescription Vit D @50 ,000 IU every week and will follow up for routine testing of vitamin D, at least 2-3 times per year. She was informed of the risk of over-replacement of vitamin  D and agrees to not increase her dose unless she discusses this with [place] first. We will recheck labs at next visit. Odean agrees to follow up with our clinic in 3 weeks.  I spent > than 50% of the 15 minute visit on counseling as documented in the note.  Obesity Rokhaya is currently in the action stage of change. As such, her goal is to continue with weight loss efforts She has agreed to follow the Category 3 plan Natara has been instructed to work up to a goal of 150 minutes of combined cardio and strengthening exercise per week for weight loss and overall health benefits. We discussed the following Behavioral Modification Strategies today: increasing lean protein intake, work on meal planning and easy cooking plans, holiday eating strategies, and celebration eating strategies   Searra has agreed to follow up with our clinic in 3 weeks. She was informed of the importance of frequent follow up visits to maximize her success with intensive lifestyle modifications for her multiple health conditions.   OBESITY BEHAVIORAL INTERVENTION VISIT  Today's visit was # 9   Starting weight: 280 lbs Starting date: 12/19/17 Today's weight : 251 lbs  Today's date: 06/28/2018 Total lbs lost to date: 10    ASK: We discussed the diagnosis of obesity with Amalia Hailey today and Kelsee agreed to give [place] permission to discuss obesity behavioral modification therapy today.  ASSESS: Glynna has the diagnosis of obesity and her BMI today is 41.77 Mariyana is in the action stage of change   ADVISE: Roselind was educated on the multiple health risks of obesity as well as the benefit of weight loss to improve  her health. She was advised of the need for long term treatment and the importance of lifestyle modifications to improve her current health and to decrease her risk of future health problems.  AGREE: Multiple dietary modification options and treatment options were discussed and  Makynleigh agreed to follow the recommendations documented in the above note.  ARRANGE: Totiana was educated on the importance of frequent visits to treat obesity as outlined per CMS and USPSTF guidelines and agreed to schedule her next follow up appointment today.  I, Burt Knack, am acting as transcriptionist for Quillian Quince, MD  I have reviewed the above documentation for accuracy and completeness, and I agree with the above. -Quillian Quince, MD

## 2018-07-10 ENCOUNTER — Encounter (INDEPENDENT_AMBULATORY_CARE_PROVIDER_SITE_OTHER): Payer: Self-pay | Admitting: Family Medicine

## 2018-07-16 DIAGNOSIS — Z130 Encounter for screening for diseases of the blood and blood-forming organs and certain disorders involving the immune mechanism: Secondary | ICD-10-CM | POA: Diagnosis not present

## 2018-07-16 DIAGNOSIS — Z6841 Body Mass Index (BMI) 40.0 and over, adult: Secondary | ICD-10-CM | POA: Diagnosis not present

## 2018-07-16 DIAGNOSIS — N915 Oligomenorrhea, unspecified: Secondary | ICD-10-CM | POA: Diagnosis not present

## 2018-07-16 DIAGNOSIS — N898 Other specified noninflammatory disorders of vagina: Secondary | ICD-10-CM | POA: Diagnosis not present

## 2018-07-16 DIAGNOSIS — Z01419 Encounter for gynecological examination (general) (routine) without abnormal findings: Secondary | ICD-10-CM | POA: Diagnosis not present

## 2018-07-16 DIAGNOSIS — Z124 Encounter for screening for malignant neoplasm of cervix: Secondary | ICD-10-CM | POA: Diagnosis not present

## 2018-07-19 DIAGNOSIS — H6121 Impacted cerumen, right ear: Secondary | ICD-10-CM | POA: Diagnosis not present

## 2018-07-19 DIAGNOSIS — J019 Acute sinusitis, unspecified: Secondary | ICD-10-CM | POA: Diagnosis not present

## 2018-07-23 ENCOUNTER — Encounter (INDEPENDENT_AMBULATORY_CARE_PROVIDER_SITE_OTHER): Payer: Self-pay

## 2018-07-23 ENCOUNTER — Ambulatory Visit (INDEPENDENT_AMBULATORY_CARE_PROVIDER_SITE_OTHER): Payer: BLUE CROSS/BLUE SHIELD | Admitting: Family Medicine

## 2018-08-06 DIAGNOSIS — J209 Acute bronchitis, unspecified: Secondary | ICD-10-CM | POA: Diagnosis not present

## 2018-08-14 ENCOUNTER — Other Ambulatory Visit (INDEPENDENT_AMBULATORY_CARE_PROVIDER_SITE_OTHER): Payer: Self-pay | Admitting: Family Medicine

## 2018-08-14 DIAGNOSIS — E559 Vitamin D deficiency, unspecified: Secondary | ICD-10-CM

## 2018-08-21 DIAGNOSIS — Z111 Encounter for screening for respiratory tuberculosis: Secondary | ICD-10-CM | POA: Diagnosis not present

## 2018-08-27 ENCOUNTER — Ambulatory Visit (INDEPENDENT_AMBULATORY_CARE_PROVIDER_SITE_OTHER): Payer: BLUE CROSS/BLUE SHIELD | Admitting: Family Medicine

## 2018-08-27 ENCOUNTER — Encounter (INDEPENDENT_AMBULATORY_CARE_PROVIDER_SITE_OTHER): Payer: Self-pay | Admitting: Family Medicine

## 2018-08-27 VITALS — BP 112/70 | HR 71 | Temp 98.4°F | Ht 65.0 in | Wt 264.0 lb

## 2018-08-27 DIAGNOSIS — Z9189 Other specified personal risk factors, not elsewhere classified: Secondary | ICD-10-CM

## 2018-08-27 DIAGNOSIS — E559 Vitamin D deficiency, unspecified: Secondary | ICD-10-CM | POA: Diagnosis not present

## 2018-08-27 DIAGNOSIS — F3289 Other specified depressive episodes: Secondary | ICD-10-CM

## 2018-08-27 DIAGNOSIS — Z6841 Body Mass Index (BMI) 40.0 and over, adult: Secondary | ICD-10-CM

## 2018-08-28 MED ORDER — VITAMIN D (ERGOCALCIFEROL) 1.25 MG (50000 UNIT) PO CAPS: 50000.0000 [IU] | ORAL_CAPSULE | ORAL | 0 refills | Status: DC

## 2018-08-28 NOTE — Progress Notes (Signed)
Office: [phone]  /  Fax: [phone]   HPI:   Chief Complaint: OBESITY Michelle Webster is here to discuss her progress with her obesity treatment plan. She is on the Category 3 plan and is following her eating plan approximately 0 % of the time. She states she is exercising 0 minutes 0 times per week. [name]'s last visit was 2 months ago before the holidays. She changed jobs and was really stressed and off track with diet. She has changed jobs again 2 weeks ago and feels this is a better option.  Her weight is 264 lb (119.7 kg) today and has gained 13 pounds since her last visit. She has lost 16 lbs since starting treatment with us.  Vitamin D Deficiency Michelle Webster has a diagnosis of vitamin D deficiency. She is stable on prescription Vit D and denies nausea, vomiting or muscle weakness.  At risk for osteopenia and osteoporosis Michelle Webster is at higher risk of osteopenia and osteoporosis due to vitamin D deficiency.   Depression with emotional eating behaviors Michelle Webster is on Celexa and her mood has decreased with recent job problems and she has increased emotional eating. Michelle Webster struggles with emotional eating and using food for comfort to the extent that it is negatively impacting her health. She often snacks when she is not hungry. Michelle Webster sometimes feels she is out of control and then feels guilty that she made poor food choices. She has been working on behavior modification techniques to help reduce her emotional eating and has been somewhat successful. She shows no sign of suicidal or homicidal ideations.  Depression screen PHQ 2/9 12/19/2017  Decreased Interest 3  Down, Depressed, Hopeless 2  PHQ - 2 Score 5  Altered sleeping 1  Tired, decreased energy 3  Change in appetite 2  Feeling bad or failure about yourself  1  Trouble concentrating 0  Moving slowly or fidgety/restless 0  Suicidal thoughts 0  PHQ-9 Score 12  Difficult doing work/chores Somewhat difficult    ASSESSMENT AND PLAN:  Vitamin  D deficiency - Plan: Vitamin D, Ergocalciferol, (DRISDOL) 1.25 MG (50000 UT) CAPS capsule  Other depression - with emotional eating  At risk for osteoporosis  Class 3 severe obesity with serious comorbidity and body mass index (BMI) of 40.0 to 44.9 in adult, unspecified obesity type (HCC)  PLAN:  Vitamin D Deficiency Michelle Webster was informed that low vitamin D levels contributes to fatigue and are associated with obesity, breast, and colon cancer. Michelle Webster agrees to continue taking prescription Vit D @50 ,000 IU every week #4 and we will refill for 1 month. She will follow up for routine testing of vitamin D, at least 2-3 times per year. She was informed of the risk of over-replacement of vitamin D and agrees to not increase her dose unless she discusses this with us first. We will recheck labs in 1 month. Michelle Webster agrees to follow up with our clinic in 2 weeks with myself or Adah Salvageawn Whitmire, FNP.  At risk for osteopenia and osteoporosis Michelle Webster was given extended (15 minutes) osteoporosis prevention counseling today. Michelle Webster is at risk for osteopenia and osteoporsis due to her vitamin D deficiency. She was encouraged to take her vitamin D and follow her higher calcium diet and increase strengthening exercise to help strengthen her bones and decrease her risk of osteopenia and osteoporosis.  Depression with Emotional Eating Behaviors We discussed behavior modification techniques today to help Michelle Webster deal with her emotional eating and depression. Michelle Webster declined Wellbutrin, and she will continue taking Celexa  and will continue to monitor closely. Cornell agrees to follow up with our clinic in 2 weeks with myself or Adah Salvage, FNP.  Obesity Michelle Webster is currently in the action stage of change. As such, her goal is to get back to weightloss efforts  She has agreed to get back to follow the Category 3 plan Michelle Webster has been instructed to work up to a goal of 150 minutes of combined cardio and strengthening exercise per  week for weight loss and overall health benefits. We discussed the following Behavioral Modification Strategies today: increasing lean protein intake, decreasing simple carbohydrates , work on meal planning and easy cooking plans and emotional eating strategies   Michelle Webster has agreed to follow up with our clinic in 2 weeks with myself or Adah Salvage, FNP. She was informed of the importance of frequent follow up visits to maximize her success with intensive lifestyle modifications for her multiple health conditions.  ALLERGIES: Allergies  Allergen Reactions  . Vicodin [Hydrocodone-Acetaminophen] Itching    MEDICATIONS: Current Outpatient Medications on File Prior to Visit  Medication Sig Dispense Refill  . ASTRAGALUS PO Take 1 tablet by mouth daily.    [name] b complex vitamins tablet Take 1 tablet by mouth daily.    . cetirizine (ZYRTEC) 10 MG tablet Take 10 mg by mouth daily as needed.     . citalopram (CELEXA) 40 MG tablet Take 40 mg by mouth daily.     [name] docusate sodium (COLACE) 100 MG capsule Take 100 mg by mouth 2 (two) times daily.    . fluticasone (FLONASE) 50 MCG/ACT nasal spray Place 2 sprays into both nostrils daily as needed.     . linaclotide (LINZESS) 290 MCG CAPS capsule Take 290 mcg by mouth daily before breakfast.    . metFORMIN (GLUCOPHAGE-XR) 500 MG 24 hr tablet Take 500 mg by mouth 2 (two) times daily.  11  . omeprazole (PRILOSEC) 20 MG capsule Take 20 mg by mouth 2 (two) times daily before a meal.     . Turmeric Curcumin 500 MG CAPS Take 1 capsule by mouth daily.     No current facility-administered medications on file prior to visit.     PAST MEDICAL HISTORY: Past Medical History:  Diagnosis Date  . Anxiety   . Back pain   . Constipation   . Dyspnea   . Eczema   . Fatty liver   . GERD (gastroesophageal reflux disease)   . HLD (hyperlipidemia)   . IBS (irritable bowel syndrome)   . Joint pain   . Morbid obesity (HCC)   . Neurotic depression   . OSA  (obstructive sleep apnea)   . Palpitations   . Prediabetes   . Vitamin D deficiency     PAST SURGICAL HISTORY: Past Surgical History:  Procedure Laterality Date  . BREAST EXCISIONAL BIOPSY Right   . GALLBLADDER SURGERY    . LUM PECTOMY      SOCIAL HISTORY: Social History   Tobacco Use  . Smoking status: Never Smoker  . Smokeless tobacco: Never Used  Substance Use Topics  . Alcohol use: No  . Drug use: Not on file    FAMILY HISTORY: Family History  Problem Relation Age of Onset  . Depression Mother        ECT  . Hypertension Mother   . Hyperlipidemia Mother   . Anxiety disorder Mother   . Obesity Mother   . Hypertension Father   . Other Father  cardiac ablations x 2  . Hyperlipidemia Father   . Depression Father   . Anxiety disorder Father   . Sleep apnea Father   . Obesity Father     ROS: Review of Systems  Constitutional: Negative for weight loss.  Gastrointestinal: Negative for nausea and vomiting.  Musculoskeletal:       Negative muscle weakness  Psychiatric/Behavioral: Positive for depression. Negative for suicidal ideas.    PHYSICAL EXAM: Blood pressure 112/70, pulse 71, temperature 98.4 F (36.9 C), temperature source Oral, height 5\' 5"  (1.651 m), weight 264 lb (119.7 kg), SpO2 99 %. Body mass index is 43.93 kg/m. Physical Exam Vitals signs reviewed.  Constitutional:      Appearance: Normal appearance. She is obese.  Cardiovascular:     Rate and Rhythm: Normal rate.     Pulses: Normal pulses.  Pulmonary:     Effort: Pulmonary effort is normal.     Breath sounds: Normal breath sounds.  Musculoskeletal: Normal range of motion.  Skin:    General: Skin is warm and dry.  Neurological:     Mental Status: She is alert and oriented to person, place, and time.  Psychiatric:        Mood and Affect: Mood normal.        Behavior: Behavior normal.     RECENT LABS AND TESTS: BMET    Component Value Date/Time   NA 140 04/19/2018 0754    K 4.5 04/19/2018 0754   CL 102 04/19/2018 0754   CO2 20 04/19/2018 0754   GLUCOSE 93 04/19/2018 0754   BUN 15 04/19/2018 0754   CREATININE 0.76 04/19/2018 0754   CALCIUM 9.7 04/19/2018 0754   GFRNONAA 92 04/19/2018 0754   GFRAA 106 04/19/2018 0754   Lab Results  Component Value Date   HGBA1C 5.6 04/19/2018   HGBA1C 5.9 (H) 12/19/2017   Lab Results  Component Value Date   INSULIN 22.7 04/19/2018   INSULIN 31.1 (H) 12/19/2017   CBC    Component Value Date/Time   WBC 7.8 04/19/2018 0754   RBC 4.85 04/19/2018 0754   HGB 12.7 04/19/2018 0754   HCT 41.2 04/19/2018 0754   MCV 85 04/19/2018 0754   MCH 26.2 (L) 04/19/2018 0754   MCHC 30.8 (L) 04/19/2018 0754   RDW 13.6 04/19/2018 0754   LYMPHSABS 1.9 04/19/2018 0754   EOSABS 0.2 04/19/2018 0754   BASOSABS 0.0 04/19/2018 0754   Iron/TIBC/Ferritin/ %Sat No results found for: IRON, TIBC, FERRITIN, IRONPCTSAT Lipid Panel     Component Value Date/Time   CHOL 168 04/19/2018 0754   TRIG 205 (H) 04/19/2018 0754   HDL 39 (L) 04/19/2018 0754   LDLCALC 88 04/19/2018 0754   Hepatic Function Panel     Component Value Date/Time   PROT 7.3 04/19/2018 0754   ALBUMIN 4.3 04/19/2018 0754   AST 14 04/19/2018 0754   ALT 11 04/19/2018 0754   ALKPHOS 98 04/19/2018 0754   BILITOT 0.2 04/19/2018 0754      Component Value Date/Time   TSH 3.610 12/19/2017 0952      OBESITY BEHAVIORAL INTERVENTION VISIT  Today's visit was # 10   Starting weight: 280 lbs Starting date: 12/19/17 Today's weight : 264 lbs  Today's date: 08/27/2018 Total lbs lost to date: 16    ASK: We discussed the diagnosis of obesity with Amalia Hailey today and Lauron agreed to give [place] permission to discuss obesity behavioral modification therapy today.  ASSESS: Jaquala has the diagnosis of obesity and her  BMI today is 43.93 Michelle Webster is in the action stage of change   ADVISE: Michelle Webster was educated on the multiple health risks of obesity as well as the benefit of  weight loss to improve her health. She was advised of the need for long term treatment and the importance of lifestyle modifications to improve her current health and to decrease her risk of future health problems.  AGREE: Multiple dietary modification options and treatment options were discussed and  Michelle Webster agreed to follow the recommendations documented in the above note.  ARRANGE: Michelle Webster was educated on the importance of frequent visits to treat obesity as outlined per CMS and USPSTF guidelines and agreed to schedule her next follow up appointment today.  I, Burt KnackSharon Martin, am acting as transcriptionist for Quillian Quincearen Reneshia Zuccaro, MD  I have reviewed the above documentation for accuracy and completeness, and I agree with the above. -Quillian Quincearen Merrianne Mccumbers, MD

## 2018-09-11 ENCOUNTER — Encounter (INDEPENDENT_AMBULATORY_CARE_PROVIDER_SITE_OTHER): Payer: Self-pay

## 2018-09-11 ENCOUNTER — Ambulatory Visit (INDEPENDENT_AMBULATORY_CARE_PROVIDER_SITE_OTHER): Payer: BLUE CROSS/BLUE SHIELD | Admitting: Family Medicine

## 2018-09-25 ENCOUNTER — Encounter (INDEPENDENT_AMBULATORY_CARE_PROVIDER_SITE_OTHER): Payer: Self-pay | Admitting: Family Medicine

## 2018-09-25 ENCOUNTER — Ambulatory Visit (INDEPENDENT_AMBULATORY_CARE_PROVIDER_SITE_OTHER): Payer: BLUE CROSS/BLUE SHIELD | Admitting: Family Medicine

## 2018-09-25 VITALS — BP 108/71 | HR 76 | Ht 65.0 in | Wt 266.0 lb

## 2018-09-25 DIAGNOSIS — F3289 Other specified depressive episodes: Secondary | ICD-10-CM

## 2018-09-25 DIAGNOSIS — Z9189 Other specified personal risk factors, not elsewhere classified: Secondary | ICD-10-CM | POA: Diagnosis not present

## 2018-09-25 DIAGNOSIS — Z6841 Body Mass Index (BMI) 40.0 and over, adult: Secondary | ICD-10-CM

## 2018-09-25 DIAGNOSIS — E559 Vitamin D deficiency, unspecified: Secondary | ICD-10-CM

## 2018-09-25 MED ORDER — BUPROPION HCL ER (SR) 150 MG PO TB12: 150.0000 mg | ORAL_TABLET | Freq: Every day | ORAL | 0 refills | Status: DC

## 2018-09-25 MED ORDER — VITAMIN D (ERGOCALCIFEROL) 1.25 MG (50000 UNIT) PO CAPS: 50000.0000 [IU] | ORAL_CAPSULE | ORAL | 0 refills | Status: DC

## 2018-09-26 DIAGNOSIS — H5213 Myopia, bilateral: Secondary | ICD-10-CM | POA: Diagnosis not present

## 2018-09-26 DIAGNOSIS — H524 Presbyopia: Secondary | ICD-10-CM | POA: Diagnosis not present

## 2018-09-26 NOTE — Progress Notes (Signed)
Office: [phone]  /  Fax: [phone]   HPI:   Chief Complaint: OBESITY Michelle Webster is here to discuss her progress with her obesity treatment plan. She is on the Category 3 plan and is following her eating plan approximately 80 % of the time. She states she is exercising 0 minutes 0 times per week. Michelle Webster continues to gain weight and is struggling mostly with emotional eating and feeling deprived.  Her weight is 266 lb (120.7 kg) today and has had a weight gain of 2 pounds over a period of 4 weeks since her last visit. She has lost 14 lbs since starting treatment with [place].  Vitamin D deficiency Michelle Webster has a diagnosis of vitamin D deficiency. She is currently stable on vit D and denies nausea, vomiting, or muscle weakness.  At risk for cardiovascular disease Michelle Webster is at a higher than average risk for cardiovascular disease due to vitamin D deficiency and obesity. She currently denies any chest pain.  Depression with emotional eating behaviors Michelle Webster's mood is decreased on Celexa, but she is using food for comfort more often to the extent that it is negatively impacting her health. She often snacks when she is not hungry. Michelle Webster sometimes feels she is out of control and frustrated that she is struggling so much. She has been working on behavior modification techniques to help reduce her emotional eating and has been somewhat successful.  ASSESSMENT AND PLAN:  Vitamin D deficiency - Plan: Vitamin D, Ergocalciferol, (DRISDOL) 1.25 MG (50000 UT) CAPS capsule  Other depression - with emotional eating  At risk for heart disease  Class 3 severe obesity with serious comorbidity and body mass index (BMI) of 40.0 to 44.9 in adult, unspecified obesity type (HCC)  PLAN:  Vitamin D Deficiency Michelle Webster was informed that low vitamin D levels contributes to fatigue and are associated with obesity, breast, and colon cancer. Michelle Webster agrees to continue to take prescription Vit D @50 ,000 IU every week #4 with  no refills and will follow up for routine testing of vitamin D, at least 2-3 times per year. She was informed of the risk of over-replacement of vitamin D and agrees to not increase her dose unless she discusses this with [place] first. Michelle Webster agrees to follow up in 3 weeks as directed.  Cardiovascular risk counseling Hagen was given extended (15 minutes) coronary artery disease prevention counseling today. She is 53 y.o. female and has risk factors for heart disease including vitamin D deficiency and obesity. We discussed intensive lifestyle modifications today with an emphasis on specific weight loss instructions and strategies. Pt was also informed of the importance of increasing exercise and decreasing saturated fats to help prevent heart disease.  Depression with Emotional Eating Behaviors We discussed behavior modification techniques today to help Michelle Webster deal with her emotional eating and depression. She has agreed to start Wellbutrin SR 150mg  qAM #30 with no refills. Chevy was referred to Dr. Dewaine Conger, our bariatric psychologist for evaluation in 1 to 2 weeks due to elevated PHQ-9 score and significant struggles with emotional eating.and agreed to follow up as directed.  Obesity Michelle Webster is currently in the action stage of change. As such, her goal is to continue with weight loss efforts. She has agreed to follow the Category 3 plan. Michelle Webster has been instructed to work up to a goal of 150 minutes of combined cardio and strengthening exercise per week for weight loss and overall health benefits. We discussed the following Behavioral Modification Strategies today: increasing lean protein intake,  decreasing simple carbohydrates, work on meal planning and easy cooking plans, and emotional eating strategies.  Michelle Webster FanningJulie has agreed to follow up with our clinic in 3 weeks. She was informed of the importance of frequent follow up visits to maximize her success with intensive lifestyle modifications for her multiple  health conditions.  ALLERGIES: Allergies  Allergen Reactions  . Vicodin [Hydrocodone-Acetaminophen] Itching    MEDICATIONS: Current Outpatient Medications on File Prior to Visit  Medication Sig Dispense Refill  . ASTRAGALUS PO Take 1 tablet by mouth daily.    [name]. b complex vitamins tablet Take 1 tablet by mouth daily.    . cetirizine (ZYRTEC) 10 MG tablet Take 10 mg by mouth daily as needed.     . citalopram (CELEXA) 40 MG tablet Take 40 mg by mouth daily.     [name]. docusate sodium (COLACE) 100 MG capsule Take 100 mg by mouth 2 (two) times daily.    . fluticasone (FLONASE) 50 MCG/ACT nasal spray Place 2 sprays into both nostrils daily as needed.     . linaclotide (LINZESS) 290 MCG CAPS capsule Take 290 mcg by mouth daily before breakfast.    . metFORMIN (GLUCOPHAGE-XR) 500 MG 24 hr tablet Take 500 mg by mouth 2 (two) times daily.  11  . omeprazole (PRILOSEC) 20 MG capsule Take 20 mg by mouth 2 (two) times daily before a meal.     . Turmeric Curcumin 500 MG CAPS Take 1 capsule by mouth daily.     No current facility-administered medications on file prior to visit.     PAST MEDICAL HISTORY: Past Medical History:  Diagnosis Date  . Anxiety   . Back pain   . Constipation   . Dyspnea   . Eczema   . Fatty liver   . GERD (gastroesophageal reflux disease)   . HLD (hyperlipidemia)   . IBS (irritable bowel syndrome)   . Joint pain   . Morbid obesity (HCC)   . Neurotic depression   . OSA (obstructive sleep apnea)   . Palpitations   . Prediabetes   . Vitamin D deficiency     PAST SURGICAL HISTORY: Past Surgical History:  Procedure Laterality Date  . BREAST EXCISIONAL BIOPSY Right   . GALLBLADDER SURGERY    . LUM PECTOMY      SOCIAL HISTORY: Social History   Tobacco Use  . Smoking status: Never Smoker  . Smokeless tobacco: Never Used  Substance Use Topics  . Alcohol use: No  . Drug use: Not on file    FAMILY HISTORY: Family History  Problem Relation Age of Onset  .  Depression Mother        ECT  . Hypertension Mother   . Hyperlipidemia Mother   . Anxiety disorder Mother   . Obesity Mother   . Hypertension Father   . Other Father        cardiac ablations x 2  . Hyperlipidemia Father   . Depression Father   . Anxiety disorder Father   . Sleep apnea Father   . Obesity Father     ROS: Review of Systems  Constitutional: Negative for weight loss.  Cardiovascular: Negative for chest pain.  Gastrointestinal: Negative for nausea and vomiting.  Musculoskeletal:       Negative for muscle weakness.  Psychiatric/Behavioral: Positive for depression.    PHYSICAL EXAM: Blood pressure 108/71, pulse 76, height 5\' 5"  (1.651 m), weight 266 lb (120.7 kg), last menstrual period 05/08/2018, SpO2 97 %. Body mass index is  44.26 kg/m. Physical Exam Vitals signs reviewed.  Constitutional:      Appearance: Normal appearance. She is obese.  Cardiovascular:     Rate and Rhythm: Normal rate.  Pulmonary:     Effort: Pulmonary effort is normal.  Musculoskeletal: Normal range of motion.  Skin:    General: Skin is warm and dry.  Neurological:     Mental Status: She is alert and oriented to person, place, and time.  Psychiatric:        Mood and Affect: Mood normal.        Behavior: Behavior normal.     RECENT LABS AND TESTS: BMET    Component Value Date/Time   NA 140 04/19/2018 0754   K 4.5 04/19/2018 0754   CL 102 04/19/2018 0754   CO2 20 04/19/2018 0754   GLUCOSE 93 04/19/2018 0754   BUN 15 04/19/2018 0754   CREATININE 0.76 04/19/2018 0754   CALCIUM 9.7 04/19/2018 0754   GFRNONAA 92 04/19/2018 0754   GFRAA 106 04/19/2018 0754   Lab Results  Component Value Date   HGBA1C 5.6 04/19/2018   HGBA1C 5.9 (H) 12/19/2017   Lab Results  Component Value Date   INSULIN 22.7 04/19/2018   INSULIN 31.1 (H) 12/19/2017   CBC    Component Value Date/Time   WBC 7.8 04/19/2018 0754   RBC 4.85 04/19/2018 0754   HGB 12.7 04/19/2018 0754   HCT 41.2  04/19/2018 0754   MCV 85 04/19/2018 0754   MCH 26.2 (L) 04/19/2018 0754   MCHC 30.8 (L) 04/19/2018 0754   RDW 13.6 04/19/2018 0754   LYMPHSABS 1.9 04/19/2018 0754   EOSABS 0.2 04/19/2018 0754   BASOSABS 0.0 04/19/2018 0754   Iron/TIBC/Ferritin/ %Sat No results found for: IRON, TIBC, FERRITIN, IRONPCTSAT Lipid Panel     Component Value Date/Time   CHOL 168 04/19/2018 0754   TRIG 205 (H) 04/19/2018 0754   HDL 39 (L) 04/19/2018 0754   LDLCALC 88 04/19/2018 0754   Hepatic Function Panel     Component Value Date/Time   PROT 7.3 04/19/2018 0754   ALBUMIN 4.3 04/19/2018 0754   AST 14 04/19/2018 0754   ALT 11 04/19/2018 0754   ALKPHOS 98 04/19/2018 0754   BILITOT 0.2 04/19/2018 0754      Component Value Date/Time   TSH 3.610 12/19/2017 0952   Results for Michelle Webster, Michelle Webster (MRN [id number]) as of 09/26/2018 08:06  Ref. Range 04/19/2018 07:54  Vitamin D, 25-Hydroxy Latest Ref Range: 30.0 - 100.0 ng/mL 38.1    OBESITY BEHAVIORAL INTERVENTION VISIT  Today's visit was # 11   Starting weight: 280 lbs Starting date: 12/19/17 Today's weight : Weight: 266 lb (120.7 kg)  Today's date: 09/25/2018 Total lbs lost to date: 14    Most Recent Value 10/03/2013 - 10/02/2018  Height 5\' 5"  (1.651 m) 09/25/2018  Weight 266 lb (120.7 kg) 09/25/2018  BMI (Calculated) 44.26 09/25/2018  BLOOD PRESSURE - SYSTOLIC 108 09/25/2018  BLOOD PRESSURE - DIASTOLIC 71 09/25/2018  Waist Measurement  57 inches 12/19/2017   Body Fat % 52 % 09/25/2018  Total Body Water (lbs) 96.2 lbs 09/25/2018  RMR 1935 12/19/2017   ASK: We discussed the diagnosis of obesity with [employer] today and Azalee agreed to give [place] permission to discuss obesity behavioral modification therapy today.  ASSESS: Aslyn has the diagnosis of obesity and her BMI today is 44.2. Zariyha is in the action stage of change.   ADVISE: Assyria was educated on the multiple health risks of  obesity as well as the benefit of weight loss to improve her  health. She was advised of the need for long term treatment and the importance of lifestyle modifications to improve her current health and to decrease her risk of future health problems.  AGREE: Multiple dietary modification options and treatment options were discussed and Arabelle agreed to follow the recommendations documented in the above note.  ARRANGE: Etoile was educated on the importance of frequent visits to treat obesity as outlined per CMS and USPSTF guidelines and agreed to schedule her next follow up appointment today.  IKirke Corin, CMA, am acting as transcriptionist for Wilder Glade, MD  I have reviewed the above documentation for accuracy and completeness, and I agree with the above. -Quillian Quince, MD

## 2018-10-15 NOTE — Progress Notes (Signed)
Office: [phone]  /  Fax: [phone]    Date: 10/16/2018  Time Seen: 3:00pm Duration: 50 minutes Provider: Glennie Isle, PsyD Type of Session: Intake for Individual Therapy  Type of Contact: Face-to-face  Informed Consent:The provider's role was explained to Michelle Webster. The provider reviewed and discussed issues of confidentiality, privacy, and limits therein. In addition to verbal informed consent, written informed consent for psychological services was obtained from Michelle Webster prior to the initial intake interview. Written consent included information concerning the practice, financial arrangements, and confidentiality and patients' rights. Since the clinic is not a 24/7 crisis center, mental health emergency resources were shared in the form of a handout, and the provider explained MyChart, e-mail, voicemail, and/or other messaging systems should be utilized only for non-emergency reasons. Michelle Webster verbally acknowledged understanding of the aforementioned, and agreed to use mental health emergency resources discussed if needed. Moreover, Michelle Webster agreed information may be shared with other [facility] providers as needed for coordination of care, and written consent was obtained.   Chief Complaint: Michelle Webster was referred by [name] due to depression with emotional eating behaviors. Per the note for the visit with [name] on 09/25/2018, "Michelle Webster's mood is decreased on Celexa, but she is using food for comfort more often to the extent that it is negatively impacting her health. She often snacks when she is not hungry. Michelle Webster sometimes feels she is out of control and frustrated that she is struggling so much. She has been working on behavior modification techniques to help reduce her emotional eating and has been somewhat successful."  During today's appointment, Michelle Webster reported, "There's a lot of stress going on." She discussed adjustment to a new job, her husband's  health, and home repairs. Additionally, Michelle Webster indicated, "I'm just really having a hard time getting back on track and getting motivated [referring to her structured meal plan]. I just love food." She added, "I don't go and stuff myself." She reported she began taking Wellbutrin after it was prescribed by Dr. Leafy Ro; however, it resulted in constipation. Thus, Michelle Webster stated she stopped the medication. It was recommended she inform Dr. Leafy Ro; she agreed. Notably, she discussed losing weight initially with the clinic, but began experiencing difficulty following the structured meal plan after she switched jobs. Moreover, Michelle Webster shared her last episode of emotional eating was approximately a week ago when she was visiting her parents for the weekend, and it was her sister's birthday. Michelle Webster shared she ate ice cream, "and lots of fattening stuff." She explained, "When I'm really stressed out or if I'm really happy, I don't even pay attention to making the right food choices." Furthermore, Michelle Webster indicated she tends to crave "anything chocolate." She also shared she likes "salty snacks."   Michelle Webster was asked to complete a questionnaire assessing various behaviors related to emotional eating. Michelle Webster endorsed the following: overeat when you are celebrating, eat certain Webster when you are anxious, stressed, depressed, or your feelings are hurt, use food to help you cope with emotional situations, find food is comforting to you and eat as a reward.  HPI: Per the note for the initial visit with [name] on 12/19/2017, Michelle Webster reported experiencing the following: significant food cravings issues , snacking frequently in the evenings, frequently drinking liquids with calories, frequently making poor food choices, frequently eating larger portions than normal  and struggling with emotional eating.    During today's appointment, Michelle Webster reported a history of losing 60 pounds with [employer] approximately  three years ago,  but she noted, "I gained it all really quick." She further shared a history of losing between 30-40 pounds "so many times." Michelle Webster indicated the onset of emotional eating was likely during her college years. She could not recall any significant event occurring during that time. Michelle Webster denied a history of binge eating. Michelle Webster denied a history of purging and engagement in other compensatory strategies, and has never been diagnosed with an eating disorder.   Mental Status Examination: Michelle Webster arrived on time for the appointment. She presented as appropriately dressed and groomed. Michelle Webster appeared her stated age and demonstrated adequate orientation to time, place, person, and purpose of the appointment. She also demonstrated appropriate eye contact. No psychomotor abnormalities or behavioral peculiarities noted. Her mood was euthymic with congruent affect. Her thought processes were logical, linear, and goal-directed. No hallucinations, delusions, bizarre thinking or behavior reported or observed. Judgment, insight, and impulse control appeared to be grossly intact. There was no evidence of paraphasias (i.e., errors in speech, gross mispronunciations, and word substitutions), repetition deficits, or disturbances in volume or prosody (i.e., rhythm and intonation). There was no evidence of attention or memory impairments. Michelle Webster denied current suicidal and homicidal ideation, plan, and intent.   Family & Psychosocial History: Michelle Webster shared she has been married for about five years, and she does not have any children. 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 Michelle Webster reported she has two bachelor's degree. 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 Michelle Webster shared she identifies with Christianity, and she attends church twice a week.   Medical History:  Past  Medical History:  Diagnosis Date  . Anxiety   . Back pain   . Constipation   . Dyspnea   . Eczema   . Fatty liver   . GERD (gastroesophageal reflux disease)   . HLD (hyperlipidemia)   . IBS (irritable bowel syndrome)   . Joint pain   . Morbid obesity ([place])   . Neurotic depression   . OSA (obstructive sleep apnea)   . Palpitations   . Prediabetes   . Vitamin D deficiency    Past Surgical History:  Procedure Laterality Date  . BREAST EXCISIONAL BIOPSY Right   . GALLBLADDER SURGERY    . LUM PECTOMY     Current Outpatient Medications on File Prior to Visit  Medication Sig Dispense Refill  . ASTRAGALUS PO Take 1 tablet by mouth daily.    [name] b complex vitamins tablet Take 1 tablet by mouth daily.    [name] buPROPion (WELLBUTRIN SR) 150 MG 12 hr tablet Take 1 tablet (150 mg total) by mouth daily. 30 tablet 0  . cetirizine (ZYRTEC) 10 MG tablet Take 10 mg by mouth daily as needed.     . citalopram (CELEXA) 40 MG tablet Take 40 mg by mouth daily.     [name] docusate sodium (COLACE) 100 MG capsule Take 100 mg by mouth 2 (two) times daily.    . fluticasone (FLONASE) 50 MCG/ACT nasal spray Place 2 sprays into both nostrils daily as needed.     . linaclotide (LINZESS) 290 MCG CAPS capsule Take 290 mcg by mouth daily before breakfast.    . metFORMIN (GLUCOPHAGE-XR) 500 MG 24 hr tablet Take 500 mg by mouth 2 (two) times daily.  11  . omeprazole (PRILOSEC) 20 MG capsule Take 20 mg by  mouth 2 (two) times daily before a meal.     . Turmeric Curcumin 500 MG CAPS Take 1 capsule by mouth daily.    . Vitamin D, Ergocalciferol, (DRISDOL) 1.25 MG (50000 UT) CAPS capsule Take 1 capsule (50,000 Units total) by mouth every 7 (seven) days. 4 capsule 0   No current facility-administered medications on file prior to visit.   Sylena denied a history of head injuries and loss of consciousness.   Mental Health History: Michelle Webster denied a history of therapeutic services. She denied a history of hospitalizations for  psychiatric concerns, and has never met with a psychiatrist. Michelle Webster shared her PCP prescribed Celexa in 2008. She explained, "I went on it after a broken relationship." Michelle Webster added, "I never went off of it" and she described it as helpful. Regarding family history of mental health, Michelle Webster indicated her mother was diagnosed with "involusional melancholia." She added, "My sister had really bad postpartum depression after both of her children." Moreover, Michelle Webster stated, "My father had short lived depression after a back surgery." Michelle Webster denied a trauma history, including psychological, physical  and sexual abuse, as well as neglect.   Michelle Webster described her typical mood as "pretty good." Between October to December, Nakeita discussed being upset all the time and crying. She indicated the adjustment to her job also impacted her sleep. Currently, she explained, "I just don't feel completely satisfied." She reported experiencing the following: fatigue; an increase in appetite; decreased self-esteem due to weight; worry thoughts regarding work; and decreased motivation with her structured meal plan. She also reported experiencing social withdrawal, and noted, "I am completely content with being home."  Phyllicia denied experiencing the following: depressed mood, hopelessness, sleep difficulties, attention and concentration issues, memory concerns, feeling fidgety/restless, irritability, obsessions and compulsions, hallucinations and delusions, paranoia, mania, angry outbursts, substance use and crying spells. She also denied history of and current suicidal ideation, plan, and intent; history of and current homicidal ideation, plan, and intent; and history of and current engagement in self-harm.  The following strengths were reported by Almyra Free: very organized, detail oriented, good listener, and very compassionate. The following strengths were observed by this provider: ability to express thoughts and feelings during the therapeutic  session, ability to establish and benefit from a therapeutic relationship, ability to learn and practice coping skills, willingness to work toward established goal(s) with the clinic and ability to engage in reciprocal conversation.  Legal History: Jamea denied a history of legal involvement.   Structured Assessment Results: The Patient Health Questionnaire-9 (PHQ-9) is a self-report measure that assesses symptoms and severity of depression over the course of the last two weeks. Masayo obtained a score of 1 suggesting minimal depression. Camora finds the endorsed symptoms to be not difficult at all. Depression screen [facility] 2/9 10/16/2018  Decreased Interest 0  Down, Depressed, Hopeless 0  PHQ - 2 Score 0  Altered sleeping 0  Tired, decreased energy 1  Change in appetite 0  Feeling bad or failure about yourself  0  Trouble concentrating 0  Moving slowly or fidgety/restless 0  Suicidal thoughts 0  PHQ-9 Score 1  Difficult doing work/chores -   The Generalized Anxiety Disorder-7 (GAD-7) is a brief self-report measure that assesses symptoms of anxiety over the course of the last two weeks. Eriko obtained a score of 1 suggesting minimal anxiety. GAD 7 : Generalized Anxiety Score 10/16/2018  Nervous, Anxious, on Edge 1  Control/stop worrying 0  Worry too much - different things 0  Trouble relaxing  0  Restless 0  Easily annoyed or irritable 0  Afraid - awful might happen 0  Total GAD 7 Score 1  Anxiety Difficulty Not difficult at all   Interventions: A chart review was conducted prior to the clinical intake interview. The PHQ-9, and GAD-7 were administered and a clinical intake interview was completed. In addition, Paxtyn was asked to complete a Mood and Food questionnaire to assess various behaviors related to emotional eating. Throughout session, empathic reflections and validation was provided. Continuing treatment with this provider was discussed and a treatment goal was established.  Psychoeducation regarding emotional versus physical hunger was provided. Abbegale was given a handout to utilize between now and the next appointment to increase awareness of hunger patterns and subsequent eating.  Provisional DSM-5 Diagnosis: 311 (F32.8) Other Specified Depressive Disorder, Emotional Eating Behaviors  Plan: Brelee appears able and willing to participate as evidenced by collaboration on a treatment goal, engagement in reciprocal conversation, and asking questions as needed for clarification. Due to Anjela's current work schedule, the next appointment will be scheduled in three weeks. The following treatment goal was established: decrease emotional eating. For the aforementioned goal, Amada can benefit from therapy sessions that are brief in duration for approximately four to six sessions. The treatment modality will be individual therapeutic services, including an eclectic therapeutic approach utilizing techniques from Cognitive Behavioral Therapy, Patient Centered Therapy, Dialectical Behavior Therapy, Acceptance and Commitment Therapy, Interpersonal Therapy, and Cognitive Restructuring. Therapeutic approach will include various interventions as appropriate, such as validation, support, mindfulness, thought defusion, reframing, psychoeducation, values assessment, and role playing. This provider will regularly review the treatment plan and medical chart to keep informed of status changes. Daysha expressed understanding and agreement with the initial treatment plan of care.

## 2018-10-16 ENCOUNTER — Ambulatory Visit (INDEPENDENT_AMBULATORY_CARE_PROVIDER_SITE_OTHER): Payer: BLUE CROSS/BLUE SHIELD | Admitting: Family Medicine

## 2018-10-16 ENCOUNTER — Encounter (INDEPENDENT_AMBULATORY_CARE_PROVIDER_SITE_OTHER): Payer: Self-pay | Admitting: Family Medicine

## 2018-10-16 ENCOUNTER — Ambulatory Visit (INDEPENDENT_AMBULATORY_CARE_PROVIDER_SITE_OTHER): Payer: BLUE CROSS/BLUE SHIELD | Admitting: Psychology

## 2018-10-16 VITALS — BP 110/71 | HR 64 | Ht 65.0 in | Wt 268.0 lb

## 2018-10-16 DIAGNOSIS — E559 Vitamin D deficiency, unspecified: Secondary | ICD-10-CM

## 2018-10-16 DIAGNOSIS — F3289 Other specified depressive episodes: Secondary | ICD-10-CM

## 2018-10-16 DIAGNOSIS — Z9189 Other specified personal risk factors, not elsewhere classified: Secondary | ICD-10-CM | POA: Diagnosis not present

## 2018-10-16 DIAGNOSIS — Z6841 Body Mass Index (BMI) 40.0 and over, adult: Secondary | ICD-10-CM

## 2018-10-16 MED ORDER — VITAMIN D (ERGOCALCIFEROL) 1.25 MG (50000 UNIT) PO CAPS: 50000.0000 [IU] | ORAL_CAPSULE | ORAL | 0 refills | Status: AC

## 2018-10-16 NOTE — Progress Notes (Signed)
Office: [phone]  /  Fax: [phone]   HPI:   Chief Complaint: OBESITY Michelle Webster is here to discuss her progress with her obesity treatment plan. She is on the Category 3 plan and is following her eating plan approximately 50 % of the time. She states she is exercising 0 minutes 0 times per week. [name] is struggling to stay on track with diet. She is still snacking a lot night and elevated stress eating. She is frustrated that she isn't doing well and wonders is she is in the action stage of change.  Her weight is 268 lb (121.6 kg) today and has gained 2 pounds since her last visit. She has lost 12 lbs since starting treatment with [place].  Vitamin D Deficiency [name] has a diagnosis of vitamin D deficiency. She is stable on prescription Vit D and denies nausea, vomiting or muscle weakness.  At risk for osteopenia and osteoporosis [name] is at higher risk of osteopenia and osteoporosis due to vitamin D deficiency.   Depression with emotional eating behaviors [name] started Wellbutrin and had worsening constipation, so she stopped. She is still struggling with emotional eating and feeling overwhelmed. She is using food for comfort to the extent that it is negatively impacting her health. She often snacks when she is not hungry. [name] sometimes feels she is out of control and then feels guilty that she made poor food choices. She has been working on behavior modification techniques to help reduce her emotional eating and has been somewhat successful. She shows no sign of suicidal or homicidal ideations.  Depression screen [name] 2/9 10/16/2018 12/19/2017  Decreased Interest 0 3  Down, Depressed, Hopeless 0 2  PHQ - 2 Score 0 5  Altered sleeping 0 1  Tired, decreased energy 1 3  Change in appetite 0 2  Feeling bad or failure about yourself  0 1  Trouble concentrating 0 0  Moving slowly or fidgety/restless 0 0  Suicidal thoughts 0 0  PHQ-9 Score 1 12  Difficult doing work/chores - Somewhat  difficult    ASSESSMENT AND PLAN:  Vitamin D deficiency - Plan: Vitamin D, Ergocalciferol, (DRISDOL) 1.25 MG (50000 UT) CAPS capsule  Other depression - with emotional eating  At risk for osteoporosis  Class 3 severe obesity with serious comorbidity and body mass index (BMI) of 40.0 to 44.9 in adult, unspecified obesity type (HCC)  PLAN:  Vitamin D Deficiency Michelle Webster was informed that low vitamin D levels contributes to fatigue and are associated with obesity, breast, and colon cancer. Michelle Webster agrees to continue taking prescription Vit D @50 ,000 IU every week #4 and we will refill for 1 month. She will follow up for routine testing of vitamin D, at least 2-3 times per year. She was informed of the risk of over-replacement of vitamin D and agrees to not increase her dose unless she discusses this with [place] first. Michelle Webster agrees to follow up with our clinic in 3 to 4 weeks.  At risk for osteopenia and osteoporosis Michelle Webster was given extended (15 minutes) osteoporosis prevention counseling today. Michelle Webster is at risk for osteopenia and osteoporsis due to her vitamin D deficiency. She was encouraged to take her vitamin D and follow her higher calcium diet and increase strengthening exercise to help strengthen her bones and decrease her risk of osteopenia and osteoporosis.  Depression with Emotional Eating Behaviors We discussed cognitive behavioral therapy today to help Michelle Webster decrease her stress, emotional eating, and depression. Michelle Webster agrees to discontinue Wellbutrin SR, and she  agrees to follow up with our clinic in 3 to 4 weeks.  Obesity Michelle Webster is currently in the action stage of change. As such, her goal is to continue with weight loss efforts She has agreed to change to keep a food journal with 1400-1600 calories and 85+ grams of protein daily Michelle Webster has been instructed to work up to a goal of 150 minutes of combined cardio and strengthening exercise per week for weight loss and overall health  benefits. We discussed the following Behavioral Modification Strategies today: increasing lean protein intake, work on meal planning and easy cooking plans and emotional eating strategies, and keep a strict food journal   Michelle Webster has agreed to follow up with our clinic in 3 to 4 weeks. She was informed of the importance of frequent follow up visits to maximize her success with intensive lifestyle modifications for her multiple health conditions.  ALLERGIES: Allergies  Allergen Reactions  . Vicodin [Hydrocodone-Acetaminophen] Itching    MEDICATIONS: Current Outpatient Medications on File Prior to Visit  Medication Sig Dispense Refill  . ASTRAGALUS PO Take 1 tablet by mouth daily.    [name] b complex vitamins tablet Take 1 tablet by mouth daily.    . cetirizine (ZYRTEC) 10 MG tablet Take 10 mg by mouth daily as needed.     . citalopram (CELEXA) 40 MG tablet Take 40 mg by mouth daily.     [name] docusate sodium (COLACE) 100 MG capsule Take 100 mg by mouth 2 (two) times daily.    . fluticasone (FLONASE) 50 MCG/ACT nasal spray Place 2 sprays into both nostrils daily as needed.     . linaclotide (LINZESS) 290 MCG CAPS capsule Take 290 mcg by mouth daily before breakfast.    . metFORMIN (GLUCOPHAGE-XR) 500 MG 24 hr tablet Take 500 mg by mouth 2 (two) times daily.  11  . omeprazole (PRILOSEC) 20 MG capsule Take 20 mg by mouth 2 (two) times daily before a meal.     . Turmeric Curcumin 500 MG CAPS Take 1 capsule by mouth daily.     No current facility-administered medications on file prior to visit.     PAST MEDICAL HISTORY: Past Medical History:  Diagnosis Date  . Anxiety   . Back pain   . Constipation   . Dyspnea   . Eczema   . Fatty liver   . GERD (gastroesophageal reflux disease)   . HLD (hyperlipidemia)   . IBS (irritable bowel syndrome)   . Joint pain   . Morbid obesity (HCC)   . Neurotic depression   . OSA (obstructive sleep apnea)   . Palpitations   . Prediabetes   . Vitamin D  deficiency     PAST SURGICAL HISTORY: Past Surgical History:  Procedure Laterality Date  . BREAST EXCISIONAL BIOPSY Right   . GALLBLADDER SURGERY    . LUM PECTOMY      SOCIAL HISTORY: Social History   Tobacco Use  . Smoking status: Never Smoker  . Smokeless tobacco: Never Used  Substance Use Topics  . Alcohol use: No  . Drug use: Not on file    FAMILY HISTORY: Family History  Problem Relation Age of Onset  . Depression Mother        ECT  . Hypertension Mother   . Hyperlipidemia Mother   . Anxiety disorder Mother   . Obesity Mother   . Hypertension Father   . Other Father        cardiac ablations x 2  .  Hyperlipidemia Father   . Depression Father   . Anxiety disorder Father   . Sleep apnea Father   . Obesity Father     ROS: Review of Systems  Constitutional: Negative for weight loss.  Gastrointestinal: Negative for nausea and vomiting.  Musculoskeletal:       Negative muscle weakness  Psychiatric/Behavioral: Positive for depression. Negative for suicidal ideas.    PHYSICAL EXAM: Blood pressure 110/71, pulse 64, height  (1.651 m), weight 268 lb (121.6 kg), SpO2 97 %. Body mass index is 44.6 kg/m. Physical Exam Vitals signs reviewed.  Constitutional:      Appearance: Normal appearance. She is obese.  Cardiovascular:     Rate and Rhythm: Normal rate.     Pulses: Normal pulses.  Pulmonary:     Effort: Pulmonary effort is normal.     Breath sounds: Normal breath sounds.  Musculoskeletal: Normal range of motion.  Skin:    General: Skin is warm and dry.  Neurological:     Mental Status: She is alert and oriented to person, place, and time.  Psychiatric:        Mood and Affect: Mood normal.        Behavior: Behavior normal.     RECENT LABS AND TESTS: BMET    Component Value Date/Time   NA 140 04/19/2018 0754   K 4.5 04/19/2018 0754   CL 102 04/19/2018 0754   CO2 20 04/19/2018 0754   GLUCOSE 93 04/19/2018 0754   BUN 15 04/19/2018 0754    CREATININE 0.76 04/19/2018 0754   CALCIUM 9.7 04/19/2018 0754   GFRNONAA 92 04/19/2018 0754   GFRAA 106 04/19/2018 0754   Lab Results  Component Value Date   HGBA1C 5.6 04/19/2018   HGBA1C 5.9 (H) 12/19/2017   Lab Results  Component Value Date   INSULIN 22.7 04/19/2018   INSULIN 31.1 (H) 12/19/2017   CBC    Component Value Date/Time   WBC 7.8 04/19/2018 0754   RBC 4.85 04/19/2018 0754   HGB 12.7 04/19/2018 0754   HCT 41.2 04/19/2018 0754   MCV 85 04/19/2018 0754   MCH 26.2 (L) 04/19/2018 0754   MCHC 30.8 (L) 04/19/2018 0754   RDW 13.6 04/19/2018 0754   LYMPHSABS 1.9 04/19/2018 0754   EOSABS 0.2 04/19/2018 0754   BASOSABS 0.0 04/19/2018 0754   Iron/TIBC/Ferritin/ %Sat No results found for: IRON, TIBC, FERRITIN, IRONPCTSAT Lipid Panel     Component Value Date/Time   CHOL 168 04/19/2018 0754   TRIG 205 (H) 04/19/2018 0754   HDL 39 (L) 04/19/2018 0754   LDLCALC 88 04/19/2018 0754   Hepatic Function Panel     Component Value Date/Time   PROT 7.3 04/19/2018 0754   ALBUMIN 4.3 04/19/2018 0754   AST 14 04/19/2018 0754   ALT 11 04/19/2018 0754   ALKPHOS 98 04/19/2018 0754   BILITOT 0.2 04/19/2018 0754      Component Value Date/Time   TSH 3.610 12/19/2017 0952      OBESITY BEHAVIORAL INTERVENTION VISIT  Today's visit was # 12   Starting weight: 280 lbs Starting date: 12/19/17 Today's weight : 268 lbs  Today's date: 10/16/2018 Total lbs lost to date: 12    10/16/2018  Height  (1.651 m)  Weight 268 lb (121.6 kg)  BMI (Calculated) 44.6  BLOOD PRESSURE - SYSTOLIC 110  BLOOD PRESSURE - DIASTOLIC 71   Body Fat % 51.5 %  Total Body Water (lbs) 94.2 lbs     ASK: We  discussed the diagnosis of obesity with Amalia Hailey today and Arlesha agreed to give [place] permission to discuss obesity behavioral modification therapy today.  ASSESS: Fadra has the diagnosis of obesity and her BMI today is 44.6 Jenny is in the action stage of change   ADVISE: Dejuana was  educated on the multiple health risks of obesity as well as the benefit of weight loss to improve her health. She was advised of the need for long term treatment and the importance of lifestyle modifications to improve her current health and to decrease her risk of future health problems.  AGREE: Multiple dietary modification options and treatment options were discussed and  Aubrie agreed to follow the recommendations documented in the above note.  ARRANGE: Harveen was educated on the importance of frequent visits to treat obesity as outlined per CMS and USPSTF guidelines and agreed to schedule her next follow up appointment today.  I, Burt Knack, am acting as transcriptionist for Quillian Quince, MD  I have reviewed the above documentation for accuracy and completeness, and I agree with the above. -Quillian Quince, MD

## 2018-10-31 ENCOUNTER — Encounter (INDEPENDENT_AMBULATORY_CARE_PROVIDER_SITE_OTHER): Payer: Self-pay

## 2018-11-05 ENCOUNTER — Ambulatory Visit: Payer: 59 | Admitting: Adult Health

## 2018-11-06 ENCOUNTER — Ambulatory Visit (INDEPENDENT_AMBULATORY_CARE_PROVIDER_SITE_OTHER): Payer: Self-pay | Admitting: Family Medicine

## 2018-11-17 ENCOUNTER — Other Ambulatory Visit (INDEPENDENT_AMBULATORY_CARE_PROVIDER_SITE_OTHER): Payer: Self-pay | Admitting: Family Medicine

## 2018-11-27 ENCOUNTER — Other Ambulatory Visit (INDEPENDENT_AMBULATORY_CARE_PROVIDER_SITE_OTHER): Payer: Self-pay | Admitting: Family Medicine

## 2018-11-27 ENCOUNTER — Encounter (INDEPENDENT_AMBULATORY_CARE_PROVIDER_SITE_OTHER): Payer: Self-pay

## 2018-11-27 DIAGNOSIS — E559 Vitamin D deficiency, unspecified: Secondary | ICD-10-CM

## 2018-11-28 DIAGNOSIS — G4733 Obstructive sleep apnea (adult) (pediatric): Secondary | ICD-10-CM | POA: Diagnosis not present

## 2018-12-22 DIAGNOSIS — E781 Pure hyperglyceridemia: Secondary | ICD-10-CM | POA: Diagnosis not present

## 2018-12-22 DIAGNOSIS — R7301 Impaired fasting glucose: Secondary | ICD-10-CM | POA: Diagnosis not present

## 2018-12-22 DIAGNOSIS — E559 Vitamin D deficiency, unspecified: Secondary | ICD-10-CM | POA: Diagnosis not present

## 2018-12-25 ENCOUNTER — Ambulatory Visit (INDEPENDENT_AMBULATORY_CARE_PROVIDER_SITE_OTHER): Payer: BLUE CROSS/BLUE SHIELD | Admitting: Family Medicine

## 2018-12-25 DIAGNOSIS — R7301 Impaired fasting glucose: Secondary | ICD-10-CM | POA: Diagnosis not present

## 2018-12-25 DIAGNOSIS — F4321 Adjustment disorder with depressed mood: Secondary | ICD-10-CM | POA: Diagnosis not present

## 2018-12-25 DIAGNOSIS — E781 Pure hyperglyceridemia: Secondary | ICD-10-CM | POA: Diagnosis not present

## 2018-12-25 DIAGNOSIS — E559 Vitamin D deficiency, unspecified: Secondary | ICD-10-CM | POA: Diagnosis not present

## 2018-12-26 ENCOUNTER — Other Ambulatory Visit: Payer: Self-pay

## 2018-12-26 ENCOUNTER — Encounter (INDEPENDENT_AMBULATORY_CARE_PROVIDER_SITE_OTHER): Payer: Self-pay | Admitting: Family Medicine

## 2018-12-26 ENCOUNTER — Ambulatory Visit (INDEPENDENT_AMBULATORY_CARE_PROVIDER_SITE_OTHER): Payer: BLUE CROSS/BLUE SHIELD | Admitting: Family Medicine

## 2018-12-26 DIAGNOSIS — F3289 Other specified depressive episodes: Secondary | ICD-10-CM | POA: Diagnosis not present

## 2018-12-26 DIAGNOSIS — E559 Vitamin D deficiency, unspecified: Secondary | ICD-10-CM

## 2018-12-26 DIAGNOSIS — Z6841 Body Mass Index (BMI) 40.0 and over, adult: Secondary | ICD-10-CM

## 2018-12-26 DIAGNOSIS — E66813 Obesity, class 3: Secondary | ICD-10-CM

## 2018-12-26 MED ORDER — BUPROPION HCL ER (SR) 150 MG PO TB12: 150.0000 mg | ORAL_TABLET | Freq: Every day | ORAL | 0 refills | Status: AC

## 2018-12-26 NOTE — Telephone Encounter (Signed)
FYI pt has an appt with today at 4:20 pm.

## 2018-12-27 NOTE — Progress Notes (Signed)
Office: [phone]  /  Fax: [phone] TeleHealth Visit:  Michelle HaileyJulie Webster has verbally consented to this TeleHealth visit today. The patient is located at work, the provider is located at the [facility] office. The participants in this visit include the listed provider and patient. The visit was conducted today via doxy.me.  HPI:   Chief Complaint: OBESITY Michelle Webster is here to discuss her progress with her obesity treatment plan. She is on the Category 3 plan and she just started back to her plan. She states she is exercising 0 minutes 0 times per week. Michelle Webster's last visit was 2 months ago right before COVID19 isolation started. She has not done any Telehealth visits up until now. She feels that she has been gaining weight and is ready to get back on track.  We were unable to weigh the patient today for this TeleHealth visit. She feels as if she has gained weight since her last visit. She has lost 12 lbs since starting treatment with us.  Vitamin D Deficiency Michelle Webster has a diagnosis of vitamin D deficiency. Her recent vitamin D labs are almost at goal as she is now on OTC vitamin D 5,000 IU per day. Michelle Webster denies nausea, vomiting, or muscle weakness.  Depression with emotional eating behaviors Michelle Webster has tried Wellbutrin in the past, but felt is made her constipation worse and stopped after 1 week. She is now on Linzess and her bowel movements are loose, so she thinks that she can restart the Wellbutrin and see how she does. She is struggling with emotional eating and using food for comfort to the extent that it is negatively impacting her health. She often snacks when she is not hungry. Michelle Webster sometimes feels she is out of control and then feels guilty that she made poor food choices. She has been working on behavior modification techniques to help reduce her emotional eating and has been somewhat successful. She shows no sign of suicidal or homicidal ideations.  [facility]  [facility] 10/16/2018 12/19/2017  Decreased Interest 0 3  Down, Depressed, Hopeless 0 2  PHQ - 2 Score 0 5  Altered sleeping 0 1  Tired, decreased energy 1 3  Change in appetite 0 2  Feeling bad or failure about yourself  0 1  Trouble concentrating 0 0  Moving slowly or fidgety/restless 0 0  Suicidal thoughts 0 0  PHQ-9 Score 1 12  Difficult doing work/chores - Somewhat difficult   ASSESSMENT AND PLAN:  Vitamin D deficiency  Other depression - with emotional eating - Plan: buPROPion (WELLBUTRIN SR) 150 MG 12 hr tablet  Class 3 severe obesity with serious comorbidity and body mass index (BMI) of 40.0 to 44.9 in adult, unspecified obesity type (HCC)  PLAN:  Vitamin D Deficiency Michelle Webster was informed that low vitamin D levels contribute to fatigue and are associated with obesity, breast, and colon cancer. Michelle Webster agrees to continue to take OTC Vit D @5 ,000 IU every day and will follow up for routine testing of vitamin D, at least 2-3 times per year. She was informed of the risk of over-replacement of vitamin D and agrees to not increase her dose unless she discusses this with us first. Michelle Webster agrees to follow up in 2 to 3 weeks as directed.  Depression with Emotional Eating Behaviors We discussed behavior modification techniques today to help Michelle Webster deal with her emotional eating and depression. She has agreed to restart Wellbutrin SR 150 mg qAM #30 with no refills and agreed  to follow up as directed.  Obesity Michelle Webster is currently in the action stage of change. As such, her goal is to continue with weight loss efforts. She has agreed to follow the Category 3 plan. Michelle Webster has been instructed to walk for 15 minutes 5 times per week. We discussed the following Behavioral Modification Strategies today: increasing lean protein intake, decreasing simple carbohydrates, and work on meal planning and easy cooking plans.  Michelle Webster has agreed to follow up with our clinic in 2 to 3 weeks. She was informed of the  importance of frequent follow up visits to maximize her success with intensive lifestyle modifications for her multiple health conditions.  ALLERGIES: Allergies  Allergen Reactions  . Vicodin [Hydrocodone-Acetaminophen] Itching    MEDICATIONS: Current Outpatient Medications on File Prior to Visit  Medication Sig Dispense Refill  . ASTRAGALUS PO Take 1 tablet by mouth daily.    [name] b complex vitamins tablet Take 1 tablet by mouth daily.    . cetirizine (ZYRTEC) 10 MG tablet Take 10 mg by mouth daily as needed.     . citalopram (CELEXA) 40 MG tablet Take 40 mg by mouth daily.     [name] docusate sodium (COLACE) 100 MG capsule Take 100 mg by mouth 2 (two) times daily.    . fluticasone (FLONASE) 50 MCG/ACT nasal spray Place 2 sprays into both nostrils daily as needed.     . linaclotide (LINZESS) 290 MCG CAPS capsule Take 290 mcg by mouth daily before breakfast.    . metFORMIN (GLUCOPHAGE-XR) 500 MG 24 hr tablet Take 500 mg by mouth 2 (two) times daily.  11  . omeprazole (PRILOSEC) 20 MG capsule Take 20 mg by mouth 2 (two) times daily before a meal.     . Turmeric Curcumin 500 MG CAPS Take 1 capsule by mouth daily.    . Vitamin D, Ergocalciferol, (DRISDOL) 1.25 MG (50000 UT) CAPS capsule Take 1 capsule (50,000 Units total) by mouth every 7 (seven) days. 4 capsule 0   No current facility-administered medications on file prior to visit.     PAST MEDICAL HISTORY: Past Medical History:  Diagnosis Date  . Anxiety   . Back pain   . Constipation   . Dyspnea   . Eczema   . Fatty liver   . GERD (gastroesophageal reflux disease)   . HLD (hyperlipidemia)   . IBS (irritable bowel syndrome)   . Joint pain   . Morbid obesity (HCC)   . Neurotic depression   . OSA (obstructive sleep apnea)   . Palpitations   . Prediabetes   . Vitamin D deficiency     PAST SURGICAL HISTORY: Past Surgical History:  Procedure Laterality Date  . BREAST EXCISIONAL BIOPSY Right   . GALLBLADDER SURGERY    . LUM  PECTOMY      SOCIAL HISTORY: Social History   Tobacco Use  . Smoking status: Never Smoker  . Smokeless tobacco: Never Used  Substance Use Topics  . Alcohol use: No  . Drug use: Not on file    FAMILY HISTORY: Family History  Problem Relation Age of Onset  . Depression Mother        ECT  . Hypertension Mother   . Hyperlipidemia Mother   . Anxiety disorder Mother   . Obesity Mother   . Hypertension Father   . Other Father        cardiac ablations x 2  . Hyperlipidemia Father   . Depression Father   . Anxiety disorder  Father   . Sleep apnea Father   . Obesity Father     ROS: Review of Systems  Gastrointestinal: Negative for nausea and vomiting.  Musculoskeletal:       Negative for muscle weakness.  Psychiatric/Behavioral: Positive for depression.    PHYSICAL EXAM: Pt in no acute distress  RECENT LABS AND TESTS: BMET    Component Value Date/Time   NA 140 04/19/2018 0754   K 4.5 04/19/2018 0754   CL 102 04/19/2018 0754   CO2 20 04/19/2018 0754   GLUCOSE 93 04/19/2018 0754   BUN 15 04/19/2018 0754   CREATININE 0.76 04/19/2018 0754   CALCIUM 9.7 04/19/2018 0754   GFRNONAA 92 04/19/2018 0754   GFRAA 106 04/19/2018 0754   Lab Results  Component Value Date   HGBA1C 5.6 04/19/2018   HGBA1C 5.9 (H) 12/19/2017   Lab Results  Component Value Date   INSULIN 22.7 04/19/2018   INSULIN 31.1 (H) 12/19/2017   CBC    Component Value Date/Time   WBC 7.8 04/19/2018 0754   RBC 4.85 04/19/2018 0754   HGB 12.7 04/19/2018 0754   HCT 41.2 04/19/2018 0754   MCV 85 04/19/2018 0754   MCH 26.2 (L) 04/19/2018 0754   MCHC 30.8 (L) 04/19/2018 0754   RDW 13.6 04/19/2018 0754   LYMPHSABS 1.9 04/19/2018 0754   EOSABS 0.2 04/19/2018 0754   BASOSABS 0.0 04/19/2018 0754   Iron/TIBC/Ferritin/ %Sat No results found for: IRON, TIBC, FERRITIN, IRONPCTSAT Lipid Panel     Component Value Date/Time   CHOL 168 04/19/2018 0754   TRIG 205 (H) 04/19/2018 0754   HDL 39 (L)  04/19/2018 0754   LDLCALC 88 04/19/2018 0754   Hepatic Function Panel     Component Value Date/Time   PROT 7.3 04/19/2018 0754   ALBUMIN 4.3 04/19/2018 0754   AST 14 04/19/2018 0754   ALT 11 04/19/2018 0754   ALKPHOS 98 04/19/2018 0754   BILITOT 0.2 04/19/2018 0754      Component Value Date/Time   TSH 3.610 12/19/2017 0952   Results for Steege, Sharnell (MRN [id number]) as of 12/27/2018 13:24  Ref. Range 04/19/2018 07:54  Vitamin D, 25-Hydroxy Latest Ref Range: 30.0 - 100.0 ng/mL 38.1     I, Kirke Corin, CMA, am acting as transcriptionist for Wilder Glade, MD I have reviewed the above documentation for accuracy and completeness, and I agree with the above. -Quillian Quince, MD

## 2019-01-15 ENCOUNTER — Telehealth: Payer: Self-pay | Admitting: *Deleted

## 2019-01-15 NOTE — Telephone Encounter (Signed)
Called pt to r/s appt from 01-17-19 at 0730 from mm/NP to amy lomax,NP same day and time if ok with pt.  Also convert appt to doxy.me Due to current COVID 19 pandemic, our office is severely reducing in office visits until further notice, in order to minimize the risk to our patients and healthcare providers. Unable to get in contact with the patient to convert their office visit with [facility] on 01/17/2019 into a doxy.me visit. I left a voicemail asking the patient to return my call. Office number was provided.     If patient calls back please convert their office visit into a doxy.me visit.

## 2019-01-17 ENCOUNTER — Ambulatory Visit: Payer: BLUE CROSS/BLUE SHIELD | Admitting: Adult Health

## 2019-02-11 ENCOUNTER — Encounter (INDEPENDENT_AMBULATORY_CARE_PROVIDER_SITE_OTHER): Payer: Self-pay | Admitting: Family Medicine

## 2019-02-11 ENCOUNTER — Other Ambulatory Visit: Payer: Self-pay | Admitting: Obstetrics and Gynecology

## 2019-02-11 DIAGNOSIS — Z1231 Encounter for screening mammogram for malignant neoplasm of breast: Secondary | ICD-10-CM

## 2019-02-13 ENCOUNTER — Telehealth (INDEPENDENT_AMBULATORY_CARE_PROVIDER_SITE_OTHER): Payer: BC Managed Care – PPO | Admitting: Family Medicine

## 2019-03-20 ENCOUNTER — Ambulatory Visit
Admission: RE | Admit: 2019-03-20 | Discharge: 2019-03-20 | Disposition: A | Discharge status: Home / Self Care | Payer: BC Managed Care – PPO | Source: Ambulatory Visit | Attending: Obstetrics and Gynecology | Admitting: Obstetrics and Gynecology

## 2019-03-20 ENCOUNTER — Other Ambulatory Visit: Payer: Self-pay

## 2019-03-20 DIAGNOSIS — Z1231 Encounter for screening mammogram for malignant neoplasm of breast: Secondary | ICD-10-CM | POA: Diagnosis not present

## 2019-05-20 ENCOUNTER — Ambulatory Visit: Payer: BC Managed Care – PPO | Admitting: Adult Health

## 2019-10-03 ENCOUNTER — Ambulatory Visit: Payer: Self-pay | Admitting: Podiatry

## 2020-06-16 IMAGING — MG DIGITAL SCREENING BILATERAL MAMMOGRAM WITH TOMO AND CAD
8 series · 8 of 24 positions shown · non-contrast
Comparison: Previous exam(s).

CLINICAL DATA: Screening. Prior benign excisional biopsy the RIGHT
breast.

EXAM:
DIGITAL SCREENING BILATERAL MAMMOGRAM WITH TOMO AND CAD

[L CC synth-2D]
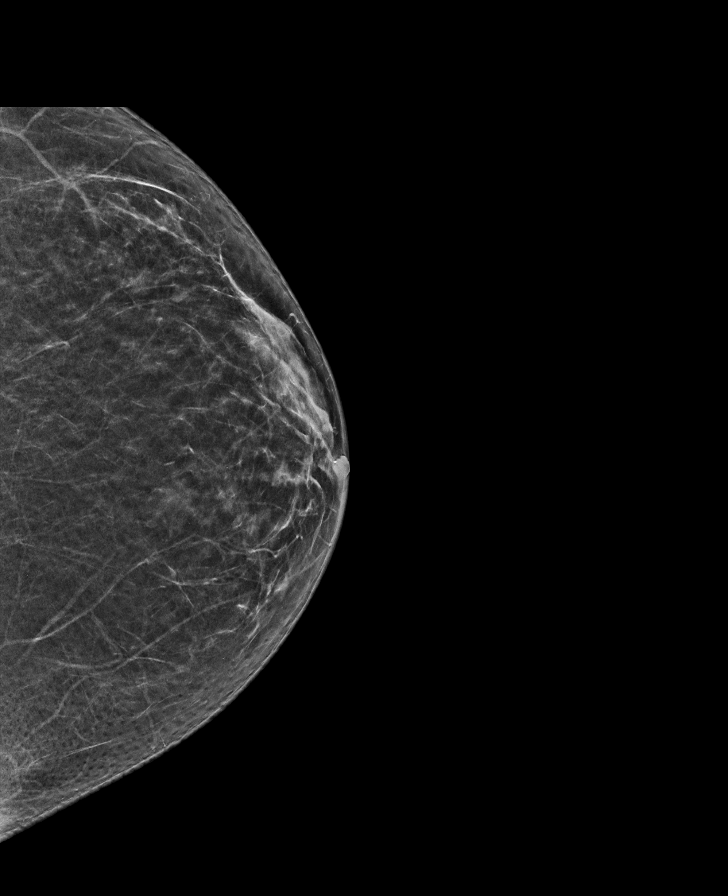

[R CC synth-2D]
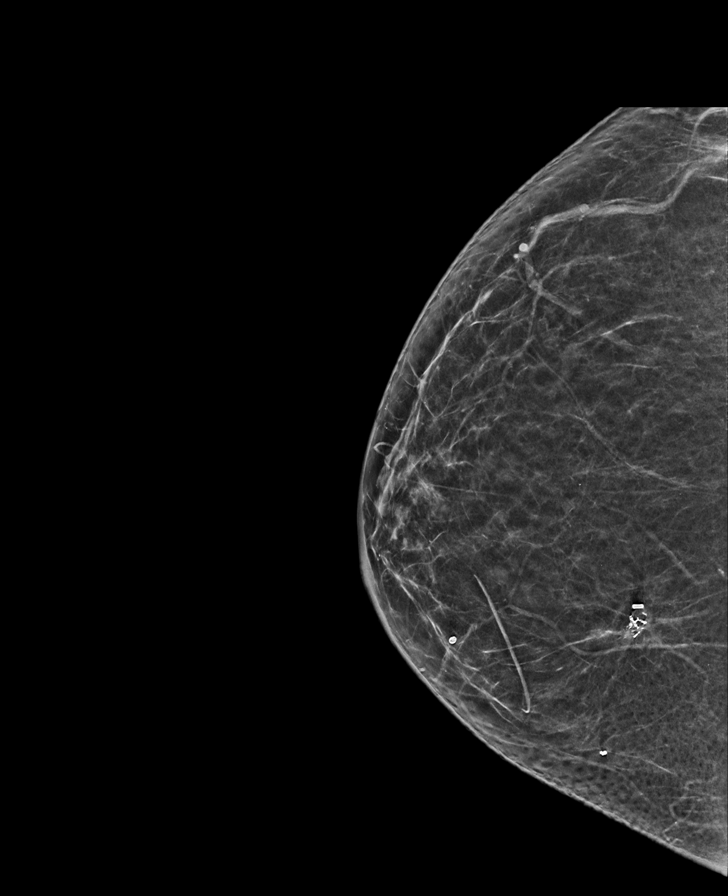

[R MLO synth-2D]
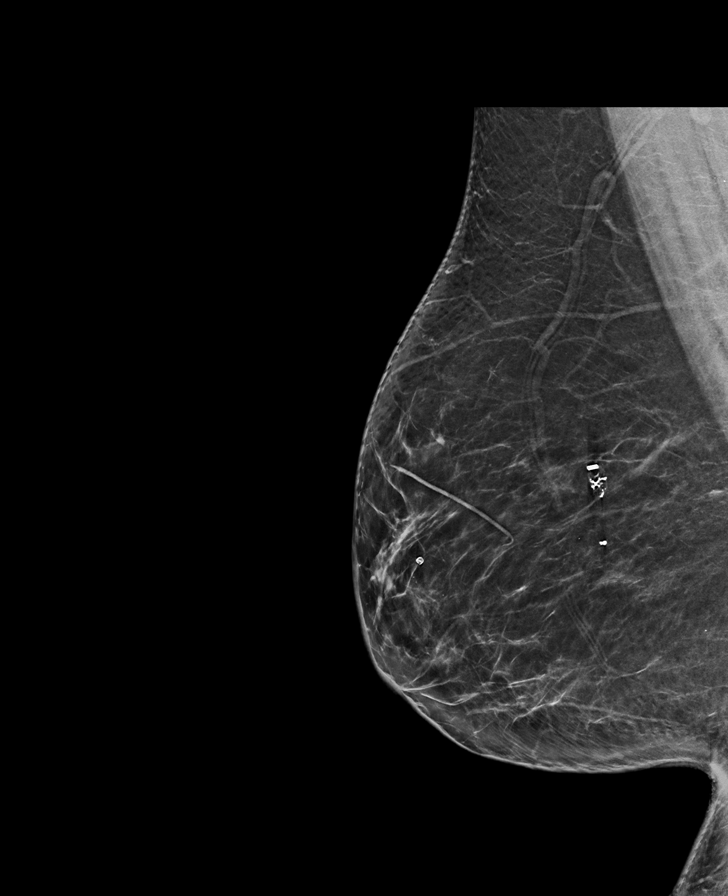

[L MLO synth-2D]
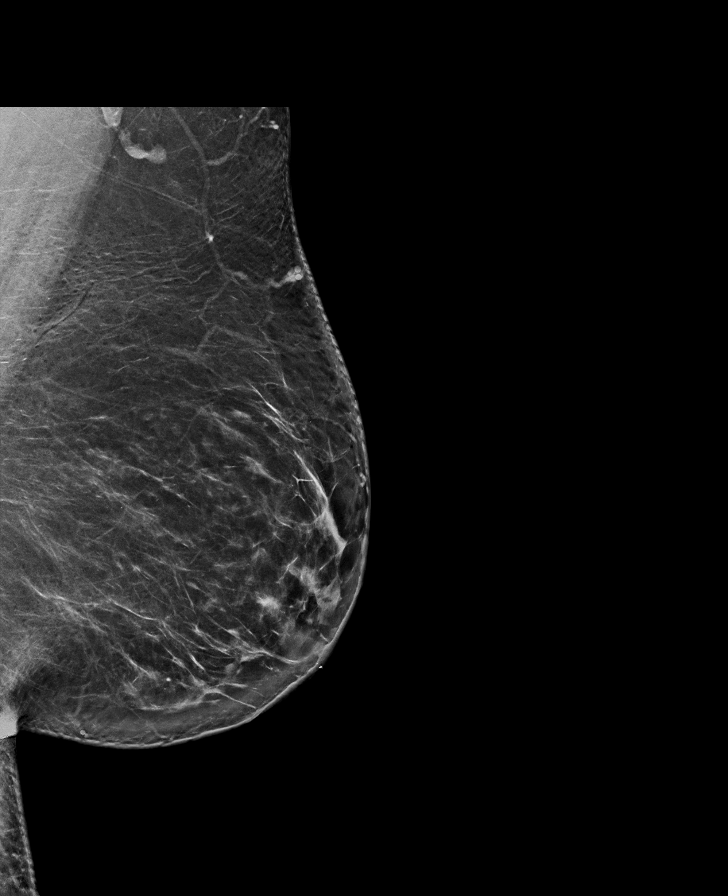

[R CC tomo · tomo slice 31/60.0]
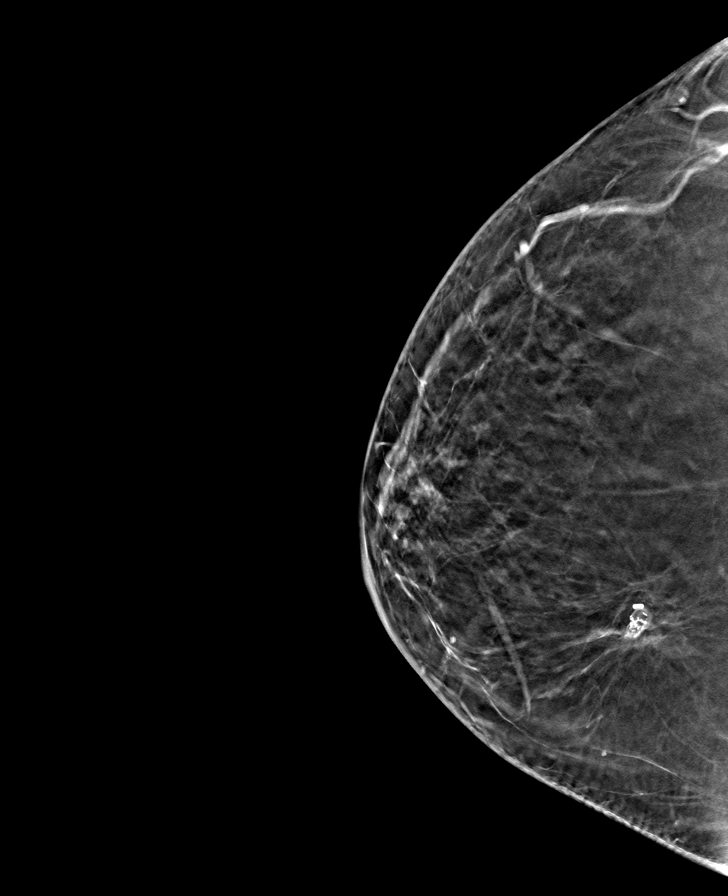

[R MLO tomo · tomo slice 38/75.0]
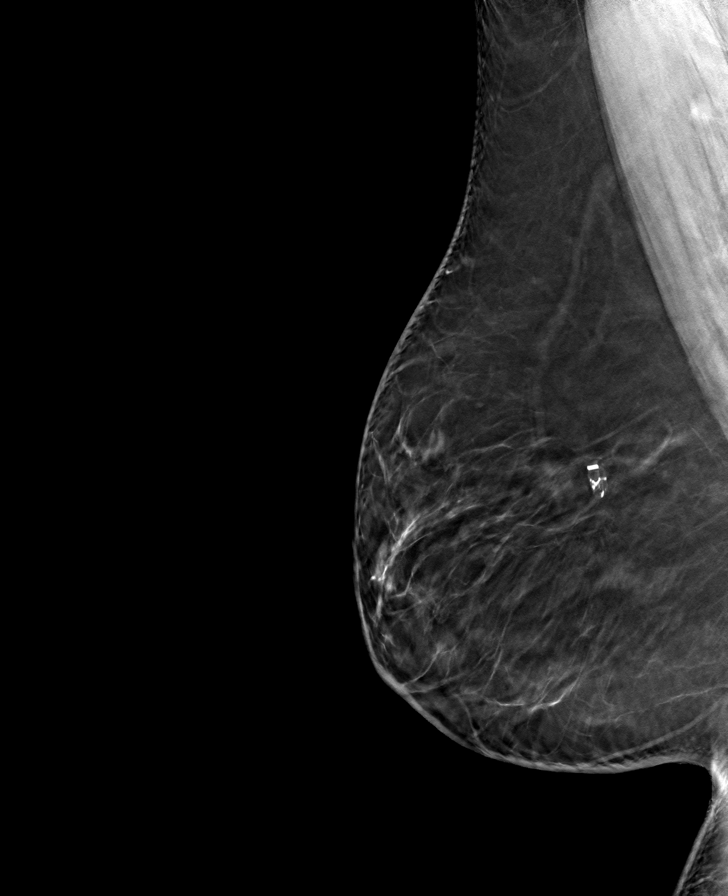

[L MLO tomo · tomo slice 42/83.0]
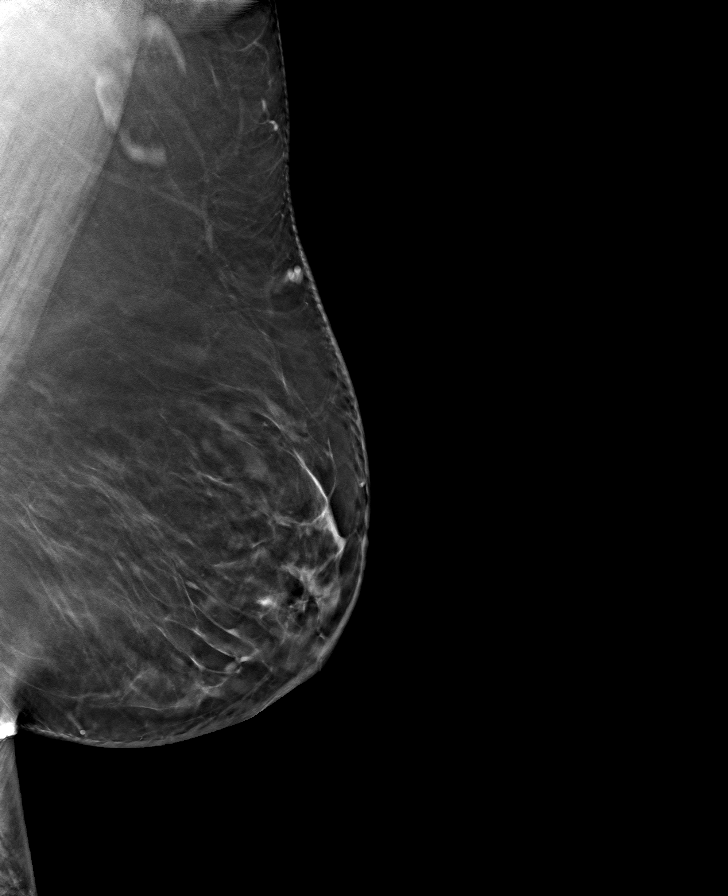

[L CC tomo · tomo slice 31/61.0]
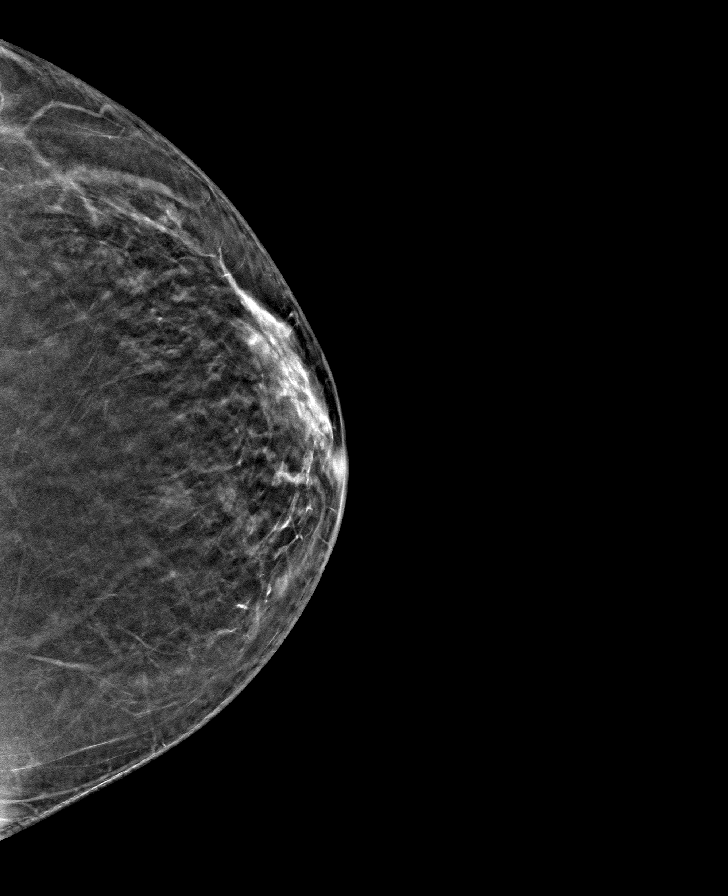

[8 of 24 positions shown; findings below may reference images not displayed]

ACR Breast Density Category b: There are scattered areas of
fibroglandular density.
FINDINGS: There are no findings suspicious for malignancy. Images were
processed with CAD.
IMPRESSION: No mammographic evidence of malignancy. A result letter of this
screening mammogram will be mailed directly to the patient.

RECOMMENDATION:
Screening mammogram in one year. (Code:C1-0-DSM)

BI-RADS CATEGORY  2: Benign.
